# Patient Record
Sex: Male | Born: 1985 | Race: Black or African American | Hispanic: No | Marital: Single | Smoking: Never smoker
Health system: Southern US, Community
[De-identification: ages and names within clinical notes are randomized; demographics above are authoritative.]

## PROBLEM LIST (undated history)

## (undated) ENCOUNTER — Emergency Department: Payer: Self-pay

## (undated) ENCOUNTER — Emergency Department (HOSPITAL_COMMUNITY): Payer: Self-pay

---

## 2006-09-12 ENCOUNTER — Emergency Department (HOSPITAL_COMMUNITY): Admission: EM | Admit: 2006-09-12 | Discharge: 2006-09-12 | Payer: Self-pay | Admitting: Family Medicine

## 2007-01-12 ENCOUNTER — Emergency Department (HOSPITAL_COMMUNITY): Admission: EM | Admit: 2007-01-12 | Discharge: 2007-01-12 | Payer: Self-pay | Admitting: Family Medicine

## 2008-11-23 ENCOUNTER — Emergency Department (HOSPITAL_COMMUNITY): Admission: EM | Admit: 2008-11-23 | Discharge: 2008-11-23 | Payer: Self-pay | Admitting: Family Medicine

## 2009-05-22 ENCOUNTER — Emergency Department (HOSPITAL_COMMUNITY): Admission: EM | Admit: 2009-05-22 | Discharge: 2009-05-22 | Payer: Self-pay | Admitting: Emergency Medicine

## 2010-11-11 ENCOUNTER — Emergency Department (HOSPITAL_COMMUNITY)
Admission: EM | Admit: 2010-11-11 | Discharge: 2010-11-11 | Payer: Self-pay | Source: Home / Self Care | Admitting: Emergency Medicine

## 2015-08-23 ENCOUNTER — Emergency Department (INDEPENDENT_AMBULATORY_CARE_PROVIDER_SITE_OTHER)
Admission: EM | Admit: 2015-08-23 | Discharge: 2015-08-23 | Disposition: A | Discharge status: Home / Self Care | Payer: Self-pay | Source: Home / Self Care | Attending: Family Medicine | Admitting: Family Medicine

## 2015-08-23 DIAGNOSIS — M545 Low back pain, unspecified: Secondary | ICD-10-CM

## 2015-08-23 LAB — POCT URINALYSIS DIP (DEVICE)
BILIRUBIN URINE: NEGATIVE
Glucose, UA: NEGATIVE mg/dL
HGB URINE DIPSTICK: NEGATIVE
Ketones, ur: NEGATIVE mg/dL
LEUKOCYTES UA: NEGATIVE
Nitrite: NEGATIVE
Protein, ur: NEGATIVE mg/dL
SPECIFIC GRAVITY, URINE: 1.015 (ref 1.005–1.030)
Urobilinogen, UA: 1 mg/dL (ref 0.0–1.0)
pH: 7 (ref 5.0–8.0)

## 2015-08-23 MED ORDER — CYCLOBENZAPRINE HCL 5 MG PO TABS: 5.0000 mg | ORAL_TABLET | Freq: Two times a day (BID) | ORAL | Status: AC | PRN

## 2015-08-23 MED ORDER — NAPROXEN 500 MG PO TABS: 500.0000 mg | ORAL_TABLET | Freq: Two times a day (BID) | ORAL | Status: AC

## 2015-08-23 NOTE — Discharge Instructions (Signed)

## 2015-08-23 NOTE — ED Notes (Signed)
The patient [facility] with a complaint of left retroperitoneal back pain that has been going on for 7 days. The patient denied any injury or other UTI symptoms.

## 2015-08-23 NOTE — ED Provider Notes (Signed)
CSN: [id number]     Arrival date & time 08/23/15  1515 History   First MD Initiated Contact with Patient 08/23/15 1659     Chief Complaint  Patient presents with  . Back Pain   (Consider location/radiation/quality/duration/timing/severity/associated sxs/prior Treatment) Patient is a 29 y.o. male presenting with back pain. The history is provided by the patient. No language interpreter was used.  Back Pain Location:  Lumbar spine (Left lumbar spine) Quality:  Aching Radiates to: Left hip area. Pain severity:  Moderate (pain is about 7/10 in severity) Pain is:  Worse during the day Onset quality:  Gradual (She woke up with the pain. He moved a furniture a day before the pain started) Duration:  7 days Timing:  Intermittent Progression:  Waxing and waning Chronicity:  New Context: lifting heavy objects   Context: not emotional stress, not falling, not jumping from heights, not MVA and not occupational injury   Context comment:  Moved furniture few days prior Relieved by:  Bed rest Worsened by:  Movement Associated symptoms: no abdominal swelling, no bladder incontinence, no bowel incontinence, no dysuria, no fever, no leg pain, no paresthesias, no pelvic pain, no tingling and no weakness   Risk factors: no hx of osteoporosis and no lack of exercise     No past medical history on file. No past surgical history on file. No family history on file. Social History  Substance Use Topics  . Smoking status: Not on file  . Smokeless tobacco: Not on file  . Alcohol Use: Not on file    Review of Systems  Constitutional: Negative for fever.  Respiratory: Negative.   Cardiovascular: Negative.   Gastrointestinal: Negative for bowel incontinence.  Genitourinary: Negative for bladder incontinence, dysuria and pelvic pain.  Musculoskeletal: Positive for back pain. Negative for joint swelling and gait problem.  Neurological: Negative for tingling, weakness and paresthesias.  All other  systems reviewed and are negative.   Allergies  Review of patient's allergies indicates no known allergies.  Home Medications   Prior to Admission medications   Not on File   Meds Ordered and Administered this Visit  Medications - No data to display  BP 149/90 mmHg  Pulse 61  Temp(Src) 97.8 F (36.6 C) (Oral)  Resp 16  SpO2 99% No data found.   Physical Exam  Constitutional: He is oriented to person, place, and time. He appears well-developed. No distress.  Cardiovascular: Normal rate, regular rhythm and normal heart sounds.   No murmur heard. Pulmonary/Chest: Effort normal and breath sounds normal. No respiratory distress. He has no wheezes.  Abdominal: Soft.  Musculoskeletal:       Lumbar back: He exhibits decreased range of motion and tenderness.       Back:  Neurological: He is alert and oriented to person, place, and time. No cranial nerve deficit.  Nursing note and vitals reviewed.   ED Course  Procedures (including critical care time)  Labs Review Labs Reviewed  POCT URINALYSIS DIP (DEVICE)    Imaging Review No results found.   Visual Acuity Review  Right Eye Distance:   Left Eye Distance:   Bilateral Distance:    Right Eye Near:   Left Eye Near:    Bilateral Near:         MDM  No diagnosis found. Lumbar spine pain. Muscle spasm.  Patient reassured he likely pulled his muscle from heavy furniture he was lifting. No neurologic deficit. Naproxen prescribed prn pain and Flexeril for muscle spasm. Return  precaution discussed. Consider xray if no improvement in 2-3 wks. He agreed with plan.    Doreene ElandKehinde T Banesa Tristan, MD 08/23/15 [phone]

## 2017-11-29 ENCOUNTER — Other Ambulatory Visit: Payer: Self-pay

## 2017-11-29 ENCOUNTER — Emergency Department (HOSPITAL_COMMUNITY): Payer: Self-pay

## 2017-11-29 ENCOUNTER — Encounter (HOSPITAL_COMMUNITY): Payer: Self-pay | Admitting: *Deleted

## 2017-11-29 ENCOUNTER — Emergency Department (HOSPITAL_COMMUNITY)
Admission: EM | Admit: 2017-11-29 | Discharge: 2017-11-30 | Disposition: A | Discharge status: Home / Self Care | Payer: Self-pay | Attending: Emergency Medicine | Admitting: Emergency Medicine

## 2017-11-29 DIAGNOSIS — J111 Influenza due to unidentified influenza virus with other respiratory manifestations: Secondary | ICD-10-CM | POA: Insufficient documentation

## 2017-11-29 DIAGNOSIS — R69 Illness, unspecified: Secondary | ICD-10-CM

## 2017-11-29 LAB — CBC WITH DIFFERENTIAL/PLATELET
Basophils Absolute: 0 10*3/uL (ref 0.0–0.1)
Basophils Relative: 0 %
EOS ABS: 0 10*3/uL (ref 0.0–0.7)
EOS PCT: 0 %
HCT: 40.7 % (ref 39.0–52.0)
Hemoglobin: 13.3 g/dL (ref 13.0–17.0)
Lymphocytes Relative: 9 %
Lymphs Abs: 1 10*3/uL (ref 0.7–4.0)
MCH: 28.2 pg (ref 26.0–34.0)
MCHC: 32.7 g/dL (ref 30.0–36.0)
MCV: 86.4 fL (ref 78.0–100.0)
MONO ABS: 1.3 10*3/uL — AB (ref 0.1–1.0)
Monocytes Relative: 13 %
Neutro Abs: 8.2 10*3/uL — ABNORMAL HIGH (ref 1.7–7.7)
Neutrophils Relative %: 78 %
PLATELETS: 248 10*3/uL (ref 150–400)
RBC: 4.71 MIL/uL (ref 4.22–5.81)
RDW: 13.9 % (ref 11.5–15.5)
WBC: 10.6 10*3/uL — ABNORMAL HIGH (ref 4.0–10.5)

## 2017-11-29 LAB — COMPREHENSIVE METABOLIC PANEL
ALBUMIN: 4.3 g/dL (ref 3.5–5.0)
ALK PHOS: 103 U/L (ref 38–126)
ALT: 56 U/L (ref 17–63)
AST: 40 U/L (ref 15–41)
Anion gap: 13 (ref 5–15)
BILIRUBIN TOTAL: 0.7 mg/dL (ref 0.3–1.2)
BUN: 5 mg/dL — AB (ref 6–20)
CALCIUM: 9.2 mg/dL (ref 8.9–10.3)
CO2: 21 mmol/L — ABNORMAL LOW (ref 22–32)
CREATININE: 1.12 mg/dL (ref 0.61–1.24)
Chloride: 103 mmol/L (ref 101–111)
GFR calc Af Amer: >60 mL/min (ref >60)
GFR calc non Af Amer: >60 mL/min (ref >60)
GLUCOSE: 111 mg/dL — AB (ref 65–99)
Potassium: 3.9 mmol/L (ref 3.5–5.1)
Sodium: 137 mmol/L (ref 135–145)
Total Protein: 7.6 g/dL (ref 6.5–8.1)

## 2017-11-29 LAB — I-STAT CG4 LACTIC ACID, ED: Lactic Acid, Venous: 0.93 mmol/L (ref 0.5–1.9)

## 2017-11-29 MED ORDER — ONDANSETRON 8 MG PO TBDP: 8.0000 mg | ORAL_TABLET | Freq: Three times a day (TID) | ORAL | 0 refills | Status: AC | PRN

## 2017-11-29 MED ORDER — ACETAMINOPHEN 325 MG PO TABS
650.0000 mg | ORAL_TABLET | Freq: Once | ORAL | Status: AC
  Administered 2017-11-29: 650 mg via ORAL
  Filled 2017-11-29: qty 2

## 2017-11-29 MED ORDER — SODIUM CHLORIDE 0.9 % IV BOLUS (SEPSIS)
1000.0000 mL | Freq: Once | INTRAVENOUS | Status: AC
  Administered 2017-11-29: 1000 mL via INTRAVENOUS

## 2017-11-29 MED ORDER — SODIUM CHLORIDE 0.9 % IV SOLN: 1000.0000 mL | INTRAVENOUS | Status: DC

## 2017-11-29 MED ORDER — ONDANSETRON 4 MG PO TBDP
4.0000 mg | ORAL_TABLET | Freq: Once | ORAL | Status: AC
  Administered 2017-11-29: 4 mg via ORAL
  Filled 2017-11-29: qty 1

## 2017-11-29 MED ORDER — IBUPROFEN 800 MG PO TABS
800.0000 mg | ORAL_TABLET | Freq: Once | ORAL | Status: AC
  Administered 2017-11-29: 800 mg via ORAL
  Filled 2017-11-29: qty 1

## 2017-11-29 MED ORDER — ONDANSETRON HCL 4 MG/2ML IJ SOLN
4.0000 mg | Freq: Once | INTRAMUSCULAR | Status: AC
  Administered 2017-11-29: 4 mg via INTRAVENOUS
  Filled 2017-11-29: qty 2

## 2017-11-29 MED ORDER — OSELTAMIVIR PHOSPHATE 75 MG PO CAPS: 75.0000 mg | ORAL_CAPSULE | Freq: Two times a day (BID) | ORAL | 0 refills | Status: AC

## 2017-11-29 MED ORDER — BENZONATATE 100 MG PO CAPS: 100.0000 mg | ORAL_CAPSULE | Freq: Three times a day (TID) | ORAL | 0 refills | Status: AC

## 2017-11-29 NOTE — ED Provider Notes (Signed)
[facility] EMERGENCY DEPARTMENT Provider Note   CSN: [id number] Arrival date & time: 11/29/17  1708     History   Chief Complaint Chief Complaint  Patient presents with  . Influenza  . Emesis    HPI Thomas Delacruz is a 32 y.o. male.  HPI Pt started with uri sx 2 days ago.  Nasal congestion, body aches.  Pt has been coughing, having nausea  and vomiting. Not able to keep fluids down.  No flu shot this year.  Family members were recently diagnosed with the flu.  History reviewed. No pertinent past medical history.  There are no active problems to display for this patient.   History reviewed. No pertinent surgical history.     Home Medications    Prior to Admission medications   Medication Sig Start Date End Date Taking? Authorizing Provider  benzonatate (TESSALON) 100 MG capsule Take 1 capsule (100 mg total) by mouth every 8 (eight) hours. 11/29/17   Linwood Dibbles, MD  cyclobenzaprine (FLEXERIL) 5 MG tablet Take 1 tablet (5 mg total) by mouth 2 (two) times daily as needed for muscle spasms. 08/23/15   Doreene Eland, MD  naproxen (NAPROSYN) 500 MG tablet Take 1 tablet (500 mg total) by mouth 2 (two) times daily. 08/23/15   Doreene Eland, MD  ondansetron (ZOFRAN ODT) 8 MG disintegrating tablet Take 1 tablet (8 mg total) by mouth every 8 (eight) hours as needed for nausea or vomiting. 11/29/17   Linwood Dibbles, MD  oseltamivir (TAMIFLU) 75 MG capsule Take 1 capsule (75 mg total) by mouth 2 (two) times daily. 11/29/17   Linwood Dibbles, MD    Family History History reviewed. No pertinent family history.  Social History Social History   Tobacco Use  . Smoking status: Never Smoker  Substance Use Topics  . Alcohol use: Not on file  . Drug use: Not on file     Allergies   Patient has no known allergies.   Review of Systems Review of Systems  All other systems reviewed and are negative.    Physical Exam Updated Vital Signs BP 129/84   Pulse  67   Temp 100.1 F (37.8 C) (Oral)   Resp 18   SpO2 98%   Physical Exam  Constitutional: He appears well-developed and well-nourished. No distress.  HENT:  Head: Normocephalic and atraumatic.  Right Ear: External ear normal.  Left Ear: External ear normal.  Eyes: Conjunctivae are normal. Right eye exhibits no discharge. Left eye exhibits no discharge. No scleral icterus.  Neck: Neck supple. No tracheal deviation present.  Cardiovascular: Normal rate, regular rhythm and intact distal pulses.  Pulmonary/Chest: Effort normal and breath sounds normal. No stridor. No respiratory distress. He has no wheezes. He has no rales.  Abdominal: Soft. Bowel sounds are normal. He exhibits no distension. There is no tenderness. There is no rebound and no guarding.  Musculoskeletal: He exhibits no edema or tenderness.  Neurological: He is alert. He has normal strength. No cranial nerve deficit (no facial droop, extraocular movements intact, no slurred speech) or sensory deficit. He exhibits normal muscle tone. He displays no seizure activity. Coordination normal.  Skin: Skin is warm and dry. No rash noted.  Psychiatric: He has a normal mood and affect.  Nursing note and vitals reviewed.    ED Treatments / Results  Labs (all labs ordered are listed, but only abnormal results are displayed) Labs Reviewed  COMPREHENSIVE METABOLIC PANEL - Abnormal; Notable for the following  components:      Result Value   CO2 21 (*)    Glucose, Bld 111 (*)    BUN 5 (*)    All other components within normal limits  CBC WITH DIFFERENTIAL/PLATELET - Abnormal; Notable for the following components:   WBC 10.6 (*)    Neutro Abs 8.2 (*)    Monocytes Absolute 1.3 (*)    All other components within normal limits  I-STAT CG4 LACTIC ACID, ED     Radiology Dg Chest 2 View  Result Date: 11/29/2017 CLINICAL DATA:  Fever and cough for 1 week EXAM: CHEST  2 VIEW COMPARISON:  None. FINDINGS: The heart size and mediastinal  contours are within normal limits. Both lungs are clear. The visualized skeletal structures are unremarkable. IMPRESSION: No active cardiopulmonary disease. Electronically Signed   By: Alcide CleverMark  Lukens M.D.   On: 11/29/2017 21:43    Procedures Procedures (including critical care time)  Medications Ordered in ED Medications  sodium chloride 0.9 % bolus 1,000 mL (1,000 mLs Intravenous New Bag/Given 11/29/17 2208)    Followed by  sodium chloride 0.9 % bolus 1,000 mL (1,000 mLs Intravenous New Bag/Given 11/29/17 2216)    Followed by  0.9 %  sodium chloride infusion (not administered)  acetaminophen (TYLENOL) tablet 650 mg (650 mg Oral Given 11/29/17 1740)  ondansetron (ZOFRAN-ODT) disintegrating tablet 4 mg (4 mg Oral Given 11/29/17 1739)  ondansetron (ZOFRAN) injection 4 mg (4 mg Intravenous Given 11/29/17 2219)  ibuprofen (ADVIL,MOTRIN) tablet 800 mg (800 mg Oral Given 11/29/17 2220)     Initial Impression / Assessment and Plan / ED Course  I have reviewed the triage vital signs and the nursing notes.  Pertinent labs & imaging results that were available during my care of the patient were reviewed by me and considered in my medical decision making (see chart for details).  Patient presented to the emergency room with cough, congestion, symptoms suggestive of an influenza-like illness.  Patient is otherwise healthy without any significant medical problems.  His laboratory tests today are reassuring.  No significant abnormalities.  Chest x-ray does not show any pneumonia.  No signs of sepsis.  Patient was treated symptomatically with IV fluids plan on discharge home with supportive care  Final Clinical Impressions(s) / ED Diagnoses   Final diagnoses:  Influenza-like illness    ED Discharge Orders        Ordered    ondansetron (ZOFRAN ODT) 8 MG disintegrating tablet  Every 8 hours PRN     11/29/17 2334    benzonatate (TESSALON) 100 MG capsule  Every 8 hours     11/29/17 2334    oseltamivir  (TAMIFLU) 75 MG capsule  2 times daily     11/29/17 2334       Linwood DibblesKnapp, Enedelia Martorelli, MD 11/29/17 2335

## 2017-11-29 NOTE — ED Notes (Signed)
Patient holding trash can in waiting area asking for something to drink.  Advised we won't be able to give him anything to drink due to vomiting/needs to be seen by provider first.

## 2017-11-29 NOTE — Discharge Instructions (Signed)
Follow-up with a primary care doctor if you are not improving by next week, take the medications as prescribed, return as needed for worsening symptoms

## 2017-11-29 NOTE — ED Triage Notes (Signed)
Pt reports exposure to flu. Not feeling well x 2 days. Reports n/v along with headache, sore throat, cough, bodyaches, fever.mask on pt at triage.

## 2017-11-30 MED ORDER — BENZONATATE 100 MG PO CAPS
100.0000 mg | ORAL_CAPSULE | Freq: Once | ORAL | Status: AC
  Administered 2017-11-30: 100 mg via ORAL
  Filled 2017-11-30: qty 1

## 2019-01-06 IMAGING — CR DG CHEST 2V
2 series · 2 of 2 positions shown · non-contrast
Comparison: None.

CLINICAL DATA: Fever and cough for 1 week

EXAM:
CHEST  2 VIEW

[chest pa]
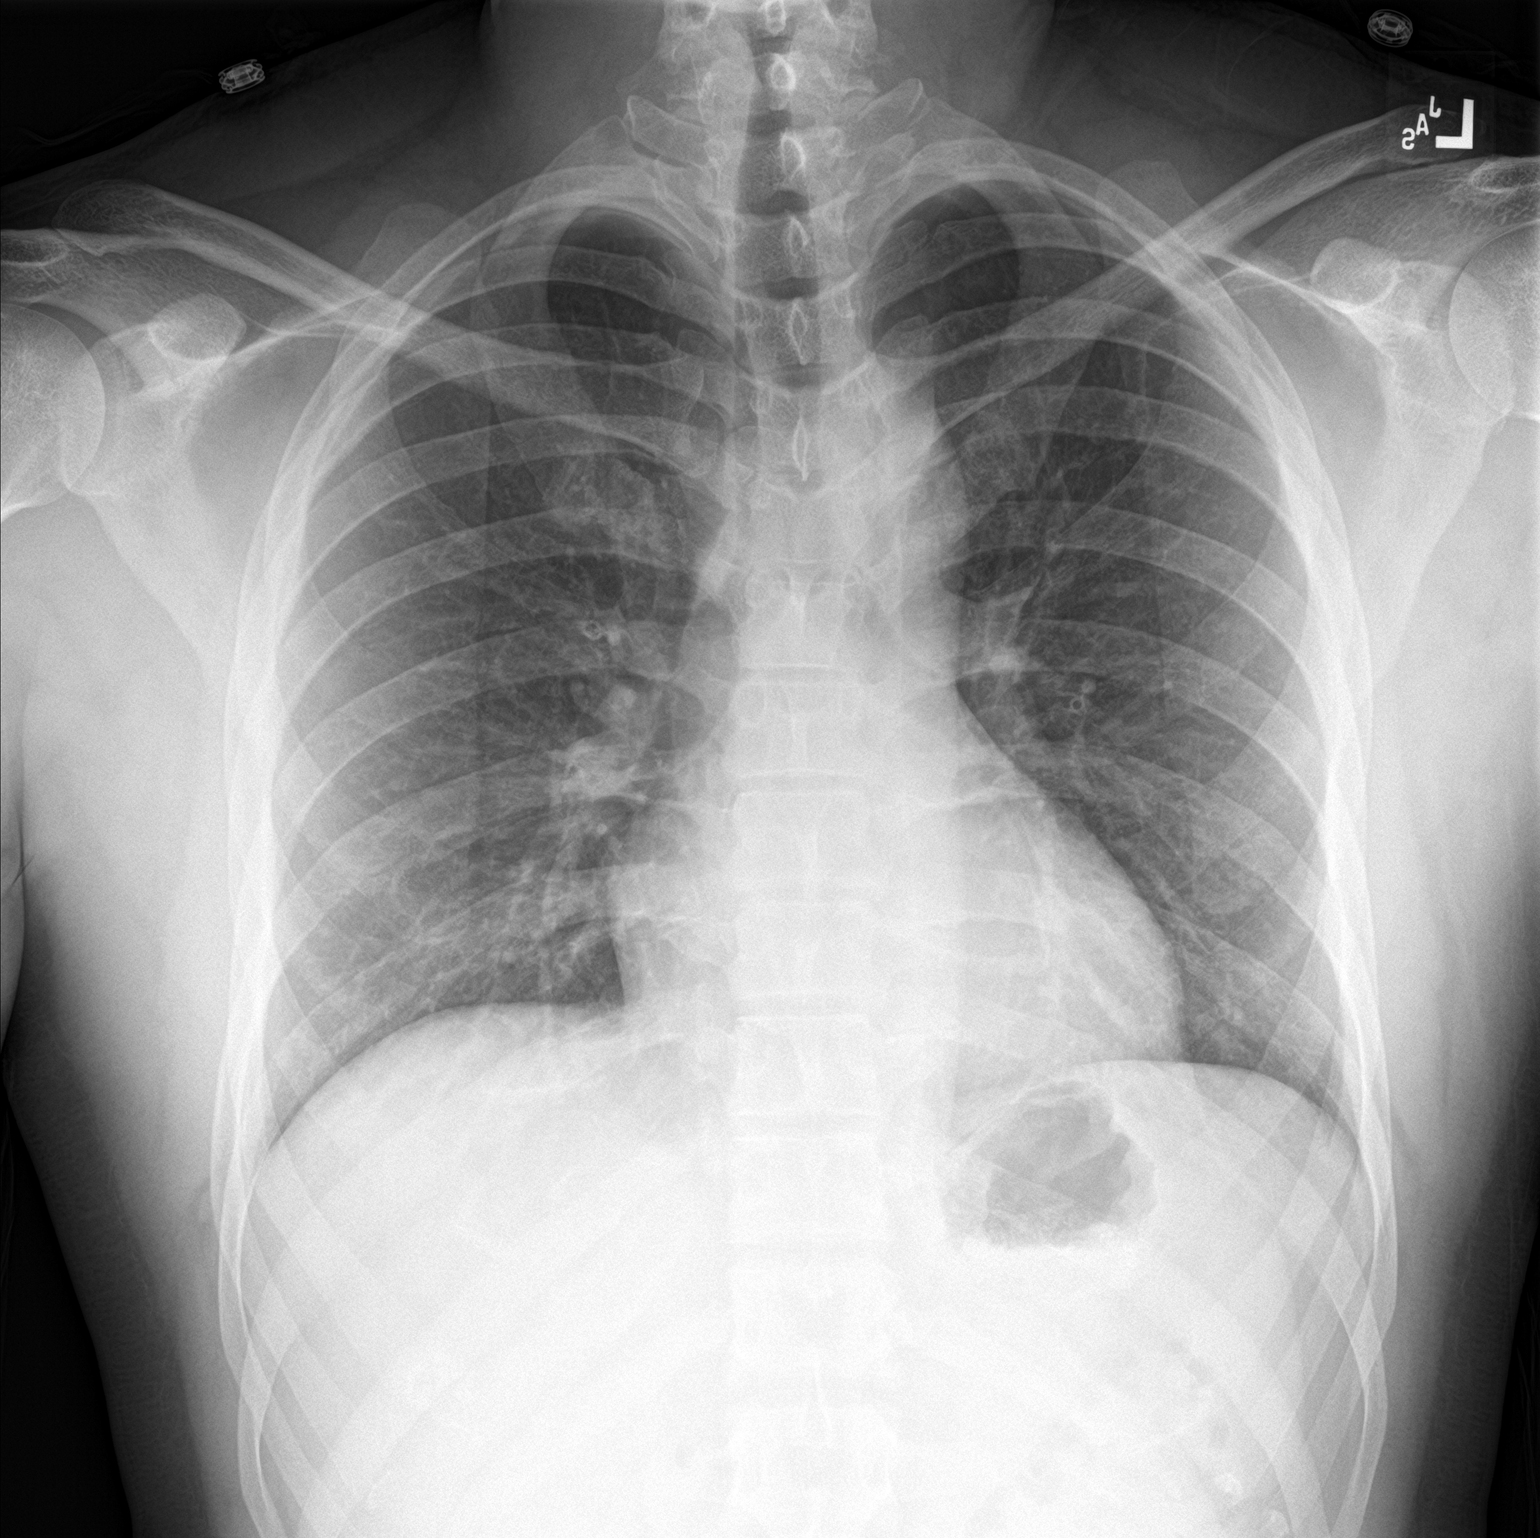

[chest lat]
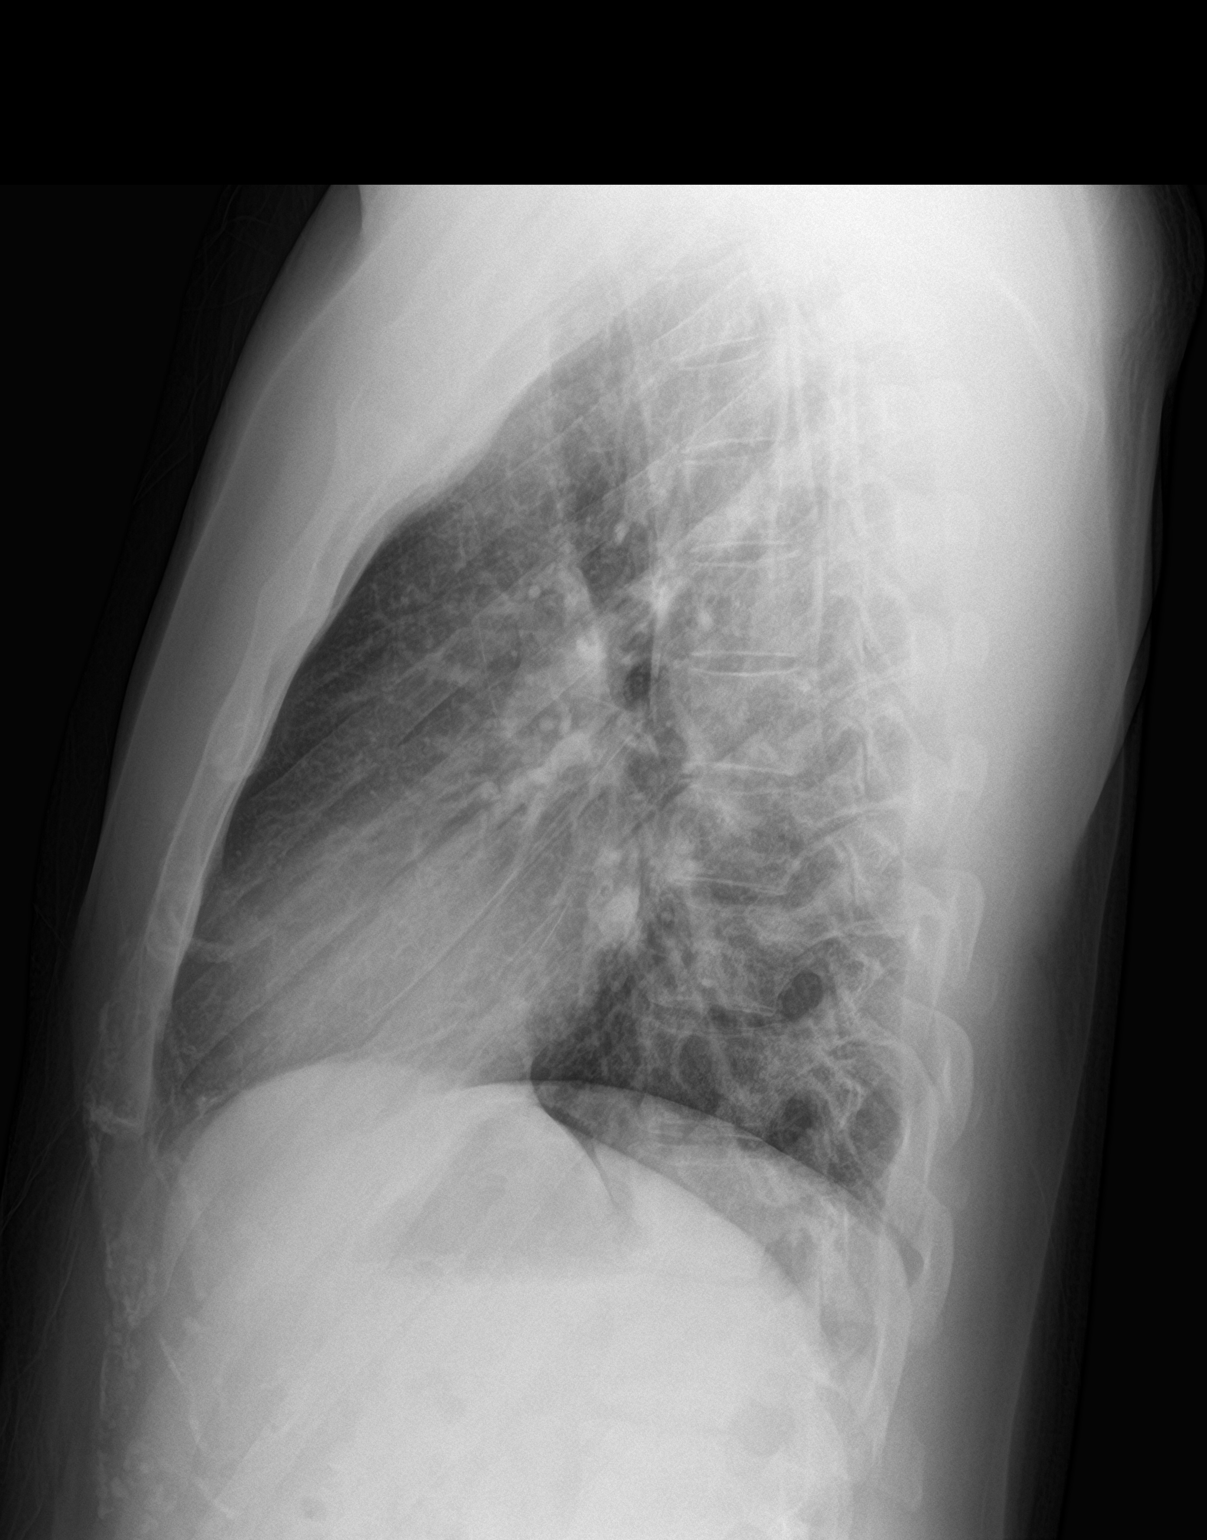

[2 of 2 positions shown; findings below may reference images not displayed]

FINDINGS: The heart size and mediastinal contours are within normal limits.
Both lungs are clear. The visualized skeletal structures are
unremarkable.
IMPRESSION: No active cardiopulmonary disease.

## 2020-03-09 ENCOUNTER — Other Ambulatory Visit: Payer: Self-pay

## 2020-03-09 ENCOUNTER — Emergency Department (HOSPITAL_COMMUNITY): Payer: Self-pay

## 2020-03-09 ENCOUNTER — Emergency Department (HOSPITAL_COMMUNITY)
Admission: EM | Admit: 2020-03-09 | Discharge: 2020-03-09 | Disposition: A | Discharge status: Home / Self Care | Payer: Self-pay | Attending: Emergency Medicine | Admitting: Emergency Medicine

## 2020-03-09 ENCOUNTER — Encounter (HOSPITAL_COMMUNITY): Payer: Self-pay | Admitting: Emergency Medicine

## 2020-03-09 DIAGNOSIS — N3001 Acute cystitis with hematuria: Secondary | ICD-10-CM

## 2020-03-09 DIAGNOSIS — N342 Other urethritis: Secondary | ICD-10-CM

## 2020-03-09 LAB — CBC WITH DIFFERENTIAL/PLATELET
Abs Immature Granulocytes: 0.04 10*3/uL (ref 0.00–0.07)
Basophils Absolute: 0.1 10*3/uL (ref 0.0–0.1)
Basophils Relative: 1 %
Eosinophils Absolute: 0.1 10*3/uL (ref 0.0–0.5)
Eosinophils Relative: 1 %
HCT: 37.7 % — ABNORMAL LOW (ref 39.0–52.0)
Hemoglobin: 11.7 g/dL — ABNORMAL LOW (ref 13.0–17.0)
Immature Granulocytes: 0 %
Lymphocytes Relative: 28 %
Lymphs Abs: 2.5 10*3/uL (ref 0.7–4.0)
MCH: 27.4 pg (ref 26.0–34.0)
MCHC: 31 g/dL (ref 30.0–36.0)
MCV: 88.3 fL (ref 80.0–100.0)
Monocytes Absolute: 0.6 10*3/uL (ref 0.1–1.0)
Monocytes Relative: 7 %
Neutro Abs: 5.7 10*3/uL (ref 1.7–7.7)
Neutrophils Relative %: 63 %
Platelets: 223 10*3/uL (ref 150–400)
RBC: 4.27 MIL/uL (ref 4.22–5.81)
RDW: 13.7 % (ref 11.5–15.5)
WBC: 9 10*3/uL (ref 4.0–10.5)
nRBC: 0 % (ref 0.0–0.2)

## 2020-03-09 LAB — BASIC METABOLIC PANEL
Anion gap: 8 (ref 5–15)
BUN: 7 mg/dL (ref 6–20)
CO2: 28 mmol/L (ref 22–32)
Calcium: 9.2 mg/dL (ref 8.9–10.3)
Chloride: 106 mmol/L (ref 98–111)
Creatinine, Ser: 0.88 mg/dL (ref 0.61–1.24)
GFR calc Af Amer: >60 mL/min (ref >60)
GFR calc non Af Amer: >60 mL/min (ref >60)
Glucose, Bld: 101 mg/dL — ABNORMAL HIGH (ref 70–99)
Potassium: 4 mmol/L (ref 3.5–5.1)
Sodium: 142 mmol/L (ref 135–145)

## 2020-03-09 LAB — URINALYSIS, ROUTINE W REFLEX MICROSCOPIC
Bilirubin Urine: NEGATIVE
Glucose, UA: NEGATIVE mg/dL
Ketones, ur: NEGATIVE mg/dL
Nitrite: NEGATIVE
Protein, ur: 100 mg/dL — AB
RBC / HPF: >50 RBC/hpf — ABNORMAL HIGH (ref 0–5)
Specific Gravity, Urine: 1.009 (ref 1.005–1.030)
WBC, UA: >50 WBC/hpf — ABNORMAL HIGH (ref 0–5)
pH: 7 (ref 5.0–8.0)

## 2020-03-09 MED ORDER — CEPHALEXIN 500 MG PO CAPS: 500.0000 mg | ORAL_CAPSULE | Freq: Three times a day (TID) | ORAL | 0 refills | Status: DC

## 2020-03-09 MED ORDER — CEFTRIAXONE SODIUM 500 MG IJ SOLR
500.0000 mg | Freq: Once | INTRAMUSCULAR | Status: AC
  Administered 2020-03-09: 500 mg via INTRAMUSCULAR
  Filled 2020-03-09: qty 500

## 2020-03-09 MED ORDER — AZITHROMYCIN 250 MG PO TABS
1000.0000 mg | ORAL_TABLET | Freq: Once | ORAL | Status: AC
  Administered 2020-03-09: 1000 mg via ORAL
  Filled 2020-03-09: qty 4

## 2020-03-09 NOTE — ED Triage Notes (Signed)
Pt states he is having hematuria since yesterday morning. With pain on his groin.

## 2020-03-09 NOTE — Discharge Instructions (Signed)
Take the antibiotics as prescribed.  Follow-up with your doctor.  You will be called if your urine culture grows bacteria and your antibiotic needs to be changed.  Return to the ED with worsening pain, fever, vomiting, any other concerns.

## 2020-03-09 NOTE — ED Provider Notes (Signed)
[facility] EMERGENCY DEPARTMENT Provider Note   CSN: [id number] Arrival date & time: 03/09/20  0107     History Chief Complaint  Patient presents with  . Hematuria    Thomas Delacruz is a 34 y.o. male.  Patient with intermittent hematuria, dysuria, frequency and urgency since yesterday.  Reports several episodes of bloody urine and then several episodes of clear urine but bleeding returned again last night.  Does have dysuria and pain with urination and urgency and frequency.  Has intermittent low back pain that comes and goes but none currently.  No abdominal pain.  No fever or vomiting.  Denies any testicular pain or penile discharge.  States he is sexually active with his fiance only.  Denies any chest pain or shortness of breath.  No history of kidney stones.  No previous abdominal surgeries.  The history is provided by the patient.  Hematuria Associated symptoms include abdominal pain. Pertinent negatives include no chest pain, no headaches and no shortness of breath.       History reviewed. No pertinent past medical history.  There are no problems to display for this patient.   History reviewed. No pertinent surgical history.     History reviewed. No pertinent family history.  Social History   Tobacco Use  . Smoking status: Never Smoker  . Smokeless tobacco: Never Used  Substance Use Topics  . Alcohol use: Yes  . Drug use: Never    Home Medications Prior to Admission medications   Medication Sig Start Date End Date Taking? Authorizing Provider  benzonatate (TESSALON) 100 MG capsule Take 1 capsule (100 mg total) by mouth every 8 (eight) hours. 11/29/17   Linwood Dibbles, MD  cyclobenzaprine (FLEXERIL) 5 MG tablet Take 1 tablet (5 mg total) by mouth 2 (two) times daily as needed for muscle spasms. 08/23/15   Doreene Eland, MD  naproxen (NAPROSYN) 500 MG tablet Take 1 tablet (500 mg total) by mouth 2 (two) times daily. 08/23/15   Doreene Eland, MD  ondansetron (ZOFRAN ODT) 8 MG disintegrating tablet Take 1 tablet (8 mg total) by mouth every 8 (eight) hours as needed for nausea or vomiting. 11/29/17   Linwood Dibbles, MD  oseltamivir (TAMIFLU) 75 MG capsule Take 1 capsule (75 mg total) by mouth 2 (two) times daily. 11/29/17   Linwood Dibbles, MD    Allergies    Patient has no known allergies.  Review of Systems   Review of Systems  Constitutional: Negative for activity change, appetite change and fever.  HENT: Negative for congestion and rhinorrhea.   Respiratory: Negative for cough, chest tightness and shortness of breath.   Cardiovascular: Negative for chest pain and leg swelling.  Gastrointestinal: Positive for abdominal pain. Negative for nausea and vomiting.  Genitourinary: Positive for dysuria, frequency, hematuria and urgency. Negative for testicular pain.  Musculoskeletal: Positive for back pain.  Skin: Negative for color change and rash.  Neurological: Negative for dizziness, weakness and headaches.   all other systems are negative except as noted in the HPI and PMH.    Physical Exam Updated Vital Signs BP 133/86 (BP Location: Left Arm)   Pulse 94   Temp 98 F (36.7 C) (Oral)   Resp 16   Ht 6\' 3"  (1.905 m)   Wt 85.7 kg   SpO2 100%   BMI 23.62 kg/m   Physical Exam Vitals and nursing note reviewed.  Constitutional:      General: He is not in acute distress.  Appearance: He is well-developed.  HENT:     Head: Normocephalic and atraumatic.     Mouth/Throat:     Pharynx: No oropharyngeal exudate.  Eyes:     Conjunctiva/sclera: Conjunctivae normal.     Pupils: Pupils are equal, round, and reactive to light.  Neck:     Comments: No meningismus. Cardiovascular:     Rate and Rhythm: Normal rate and regular rhythm.     Heart sounds: Normal heart sounds. No murmur.  Pulmonary:     Effort: Pulmonary effort is normal. No respiratory distress.     Breath sounds: Normal breath sounds.  Abdominal:      Palpations: Abdomen is soft.     Tenderness: There is no abdominal tenderness. There is no guarding or rebound.  Genitourinary:    Comments: Testicles nontender.  Circumcised.  No penile discharge Musculoskeletal:        General: No tenderness. Normal range of motion.     Cervical back: Normal range of motion and neck supple.  Skin:    General: Skin is warm.  Neurological:     Mental Status: He is alert and oriented to person, place, and time.     Cranial Nerves: No cranial nerve deficit.     Motor: No abnormal muscle tone.     Coordination: Coordination normal.     Comments: No ataxia on finger to nose bilaterally. No pronator drift. 5/5 strength throughout. CN 2-12 intact.Equal grip strength. Sensation intact.   Psychiatric:        Behavior: Behavior normal.     ED Results / Procedures / Treatments   Labs (all labs ordered are listed, but only abnormal results are displayed) Labs Reviewed  URINALYSIS, ROUTINE W REFLEX MICROSCOPIC - Abnormal; Notable for the following components:      Result Value   APPearance CLOUDY (*)    Hgb urine dipstick LARGE (*)    Protein, ur 100 (*)    Leukocytes,Ua LARGE (*)    RBC / HPF >50 (*)    WBC, UA >50 (*)    Bacteria, UA MANY (*)    All other components within normal limits  BASIC METABOLIC PANEL - Abnormal; Notable for the following components:   Glucose, Bld 101 (*)    All other components within normal limits  CBC WITH DIFFERENTIAL/PLATELET - Abnormal; Notable for the following components:   Hemoglobin 11.7 (*)    HCT 37.7 (*)    All other components within normal limits  URINE CULTURE    EKG None  Radiology CT Renal Stone Study  Result Date: 03/09/2020 CLINICAL DATA:  Hematuria. EXAM: CT ABDOMEN AND PELVIS WITHOUT CONTRAST TECHNIQUE: Multidetector CT imaging of the abdomen and pelvis was performed following the standard protocol without IV contrast. COMPARISON:  None. FINDINGS: Lower chest: No acute abnormality. Hepatobiliary:  No focal liver abnormality is seen. No gallstones, gallbladder wall thickening, or biliary dilatation. Pancreas: Unremarkable. No pancreatic ductal dilatation or surrounding inflammatory changes. Spleen: Normal in size without focal abnormality. Adrenals/Urinary Tract: Adrenal glands are unremarkable. Kidneys are normal, without renal calculi, focal lesion, or hydronephrosis. Mild circumferential bladder wall thickening. Stomach/Bowel: Stomach is within normal limits. Appendix appears normal. No evidence of bowel wall thickening, distention, or inflammatory changes. Vascular/Lymphatic: No significant vascular findings are present. No enlarged abdominal or pelvic lymph nodes. Reproductive: Prostate is unremarkable. Other: Tiny fat containing umbilical hernia. No free fluid or pneumoperitoneum. Musculoskeletal: No acute or significant osseous findings. IMPRESSION: 1. Mild circumferential bladder wall thickening, which could reflect  cystitis given clinical history. Correlate with urinalysis. 2. No urolithiasis. Electronically Signed   By: Obie Dredge M.D.   On: 03/09/2020 07:23    Procedures Procedures (including critical care time)  Medications Ordered in ED Medications - No data to display  ED Course  I have reviewed the triage vital signs and the nursing notes.  Pertinent labs & imaging results that were available during my care of the patient were reviewed by me and considered in my medical decision making (see chart for details).    MDM Rules/Calculators/A&P                     1 day of hematuria, urgency and dysuria.  Afebrile, no distress  Urinalysis shows many red cells, many white cells and leukocyte esterase.  We will send culture.  Suspicious for urethritis  Will treat with IM rocephin and zithromax.  CT without ureteral stone. Does show bladder wall thickening. Urine culture sent. Will also treat for UTI.   Safe sex practices discussed. followup with PCP. Return to the ED with  worsening pain, fever, vomiting, or any other concerns.  Final Clinical Impression(s) / ED Diagnoses Final diagnoses:  Acute cystitis with hematuria  Urethritis    Rx / DC Orders ED Discharge Orders    None       [place]molola Mittman, Jeannett Senior, MD 03/09/20 [phone]

## 2020-03-10 LAB — URINE CULTURE: Culture: >=100000 — AB

## 2020-03-11 ENCOUNTER — Telehealth: Payer: Self-pay

## 2020-03-11 NOTE — Telephone Encounter (Signed)
Post ED Visit - Positive Culture Follow-up  Culture report reviewed by antimicrobial stewardship pharmacist: [facility] []  , Pharm.D. []  Enzo Bi, Pharm.D., BCPS AQ-ID []  , Pharm.D., BCPS []  Celedonio Miyamoto, Pharm.D., BCPS []  [place], Garvin Fila.D., BCPS, AAHIVP []  , Pharm.D., BCPS, AAHIVP []  Georgina Pillion, PharmD, BCPS []  , PharmD, BCPS []  [place], PharmD, BCPS []  [address], PharmD []  , PharmD, BCPS []  Estella Husk, PharmD [facility] []  Lysle Pearl, PharmD []  , PharmD []  Phillips Climes, PharmD []  , Rph []  Agapito Games) , PharmD []  Verlan Friends, PharmD []  , PharmD []  Mervyn Gay, PharmD []  , PharmD []  Vinnie Level, PharmD []  Yolande Jolly, PharmD []  , PharmD []  Len Childs, PharmD   Positive urine culture Treated with Cephalexin, organism sensitive to the same and no further patient follow-up is required at this time.  03/11/2020, 9:57 AM

## 2021-04-16 IMAGING — CT CT RENAL STONE PROTOCOL
2 of 4 series · 16 of 46 positions shown, 18 images · non-contrast
Comparison: None.

CLINICAL DATA: Hematuria.

EXAM:
CT ABDOMEN AND PELVIS WITHOUT CONTRAST
TECHNIQUE: Multidetector CT imaging of the abdomen and pelvis was performed
following the standard protocol without IV contrast.

[Series 3: stone study 5.0 i30f 2 · axial · 0.63mm/px · z∈[-469,-29]mm · 13 of 96 slices shown, 15 images]
[im 4/96  soft-tissue]
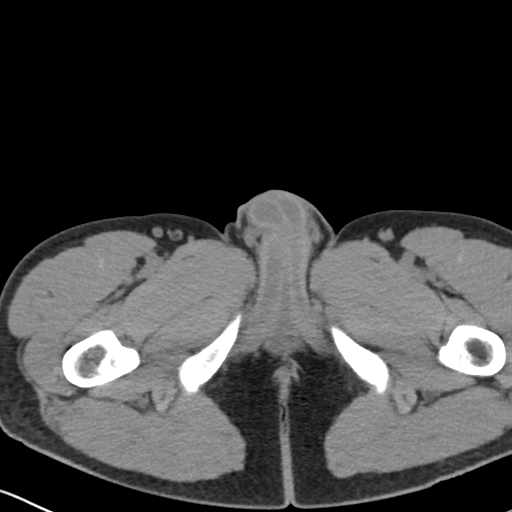
[im 4/96  bone]
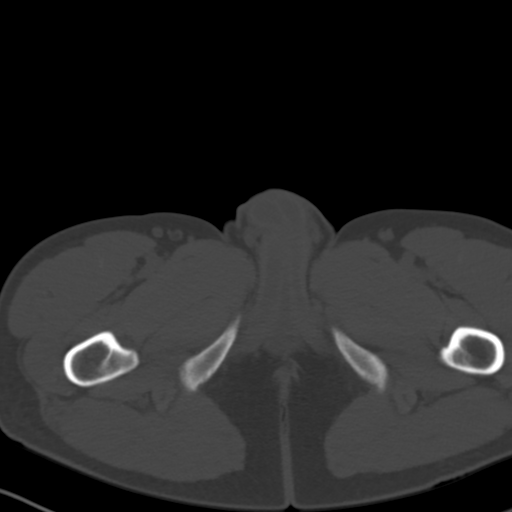
[im 11/96  soft-tissue]
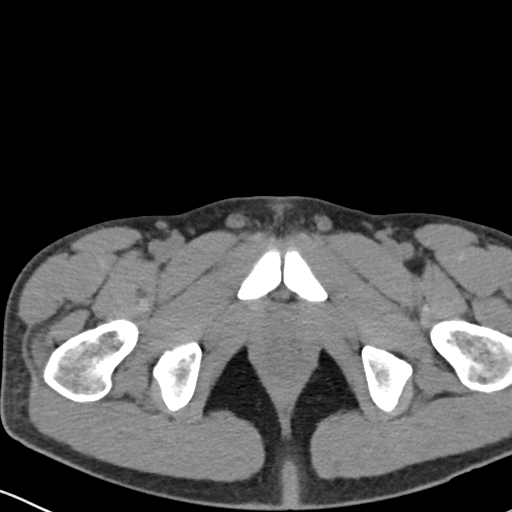
[im 19/96  soft-tissue]
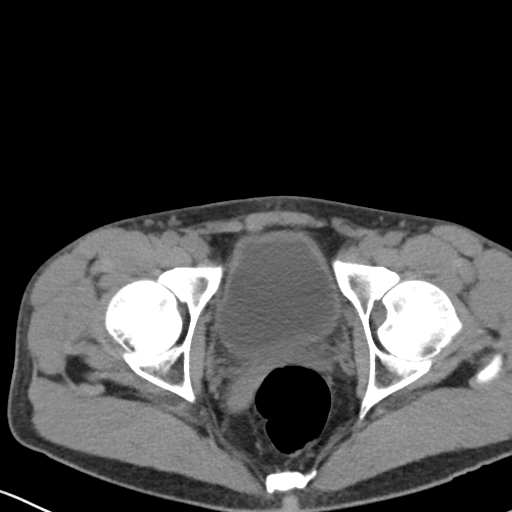
[im 26/96  soft-tissue]
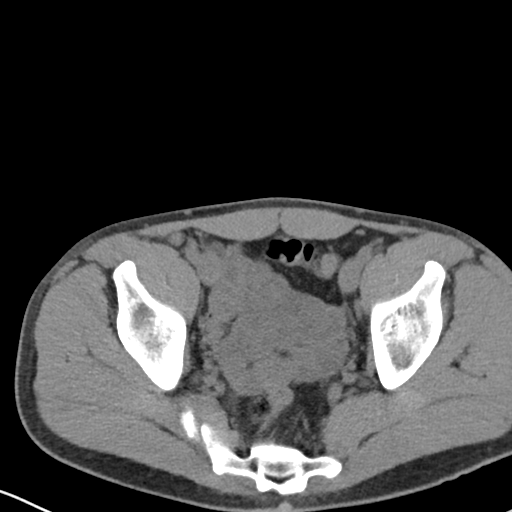
[im 33/96  soft-tissue]
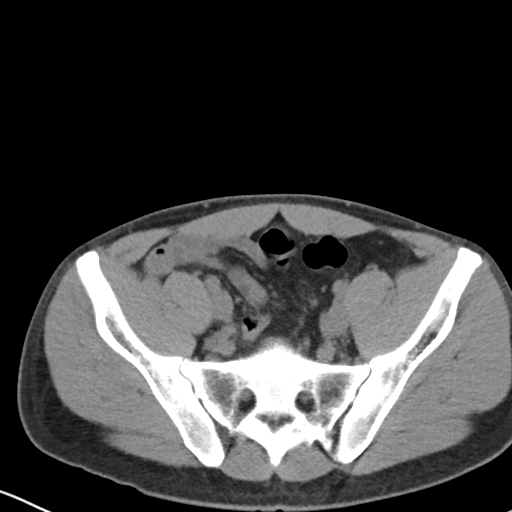
[im 41/96  soft-tissue]
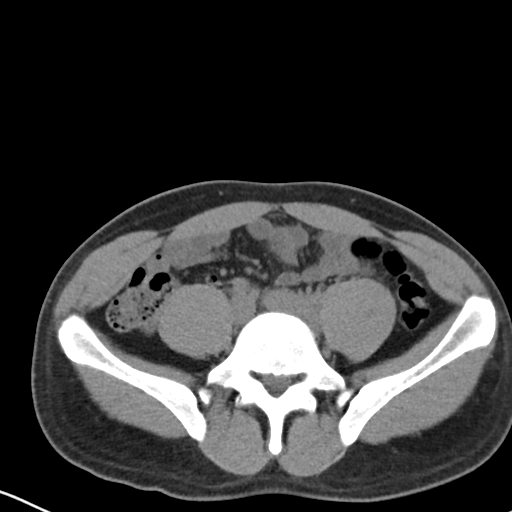
[im 48/96  soft-tissue]
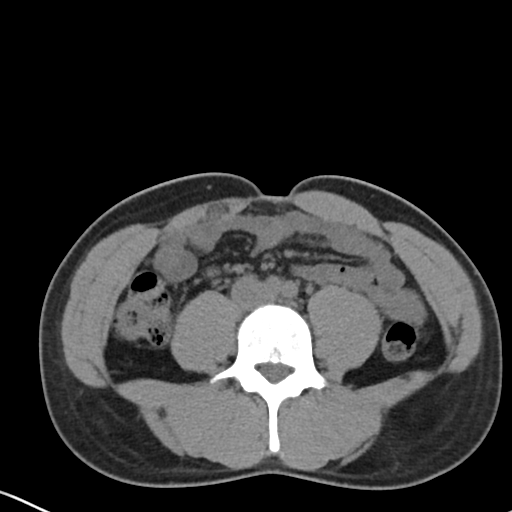
[im 55/96  soft-tissue]
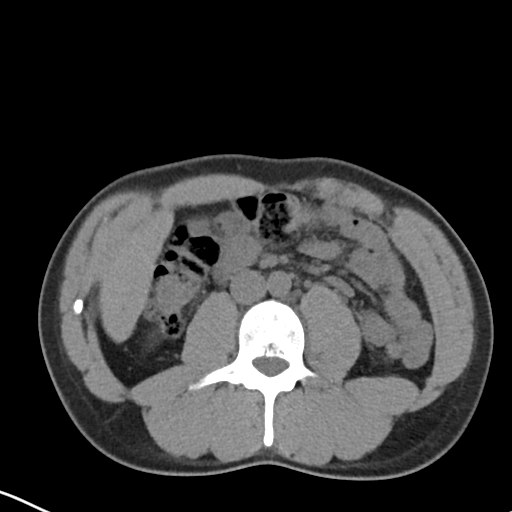
[im 63/96  soft-tissue]
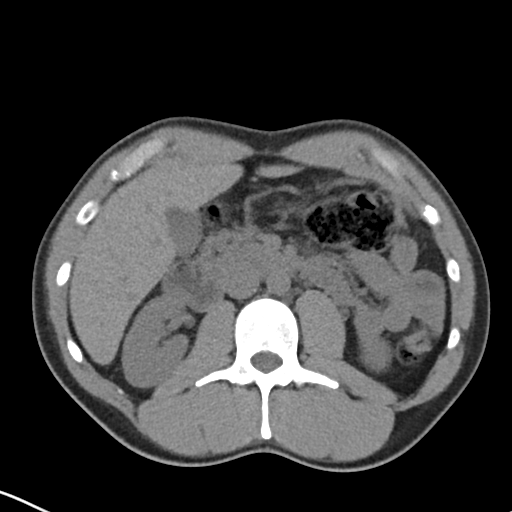
[im 63/96  bone]
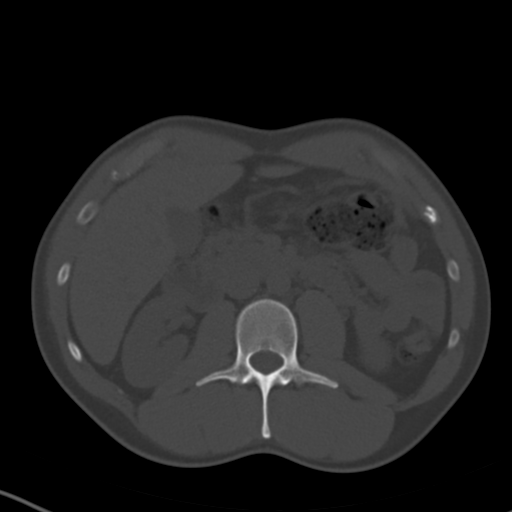
[im 70/96  soft-tissue]
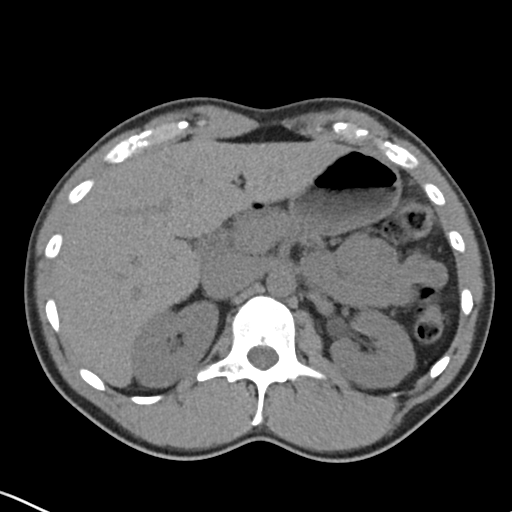
[im 77/96  soft-tissue]
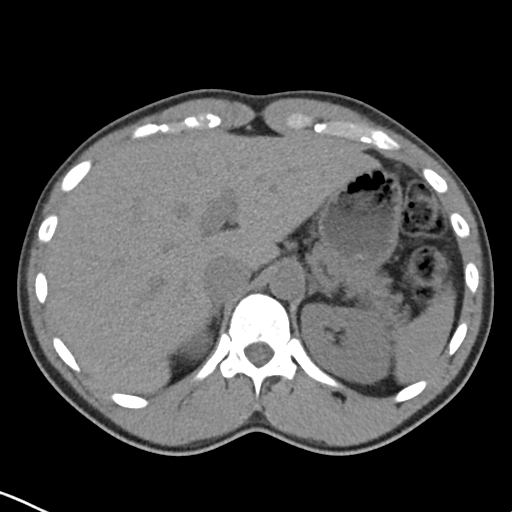
[im 85/96  soft-tissue]
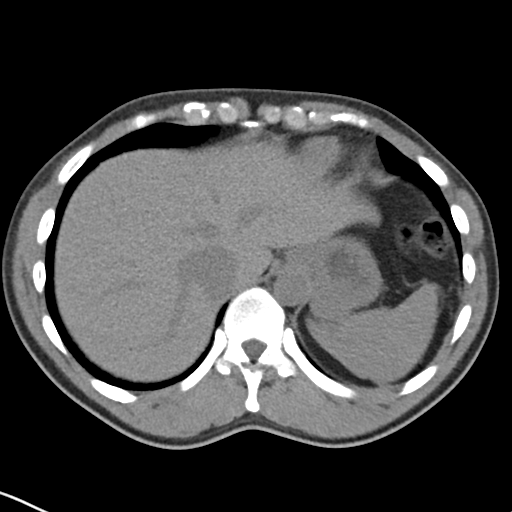
[im 92/96  soft-tissue]
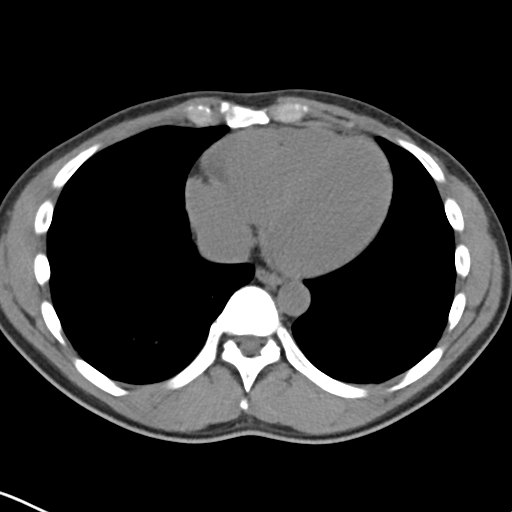

[Series 6: coronal soft tissue · coronal · 0.68mm/px · 3 of 101 slices shown]
[im 34/101  soft-tissue]
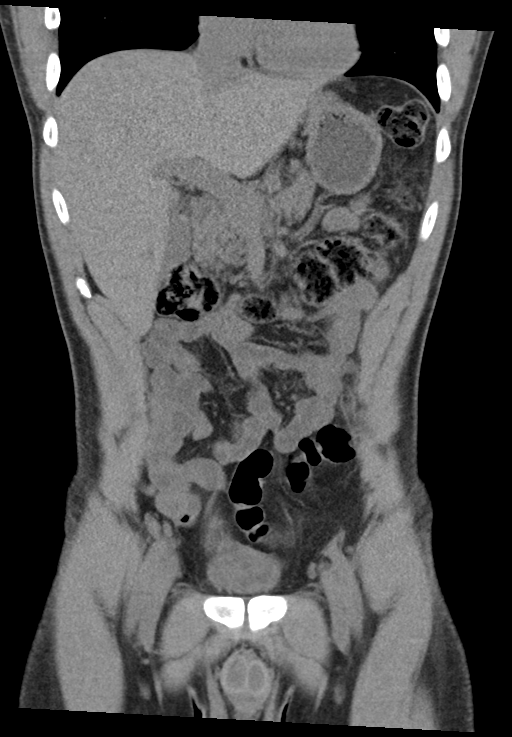
[im 45/101  soft-tissue]
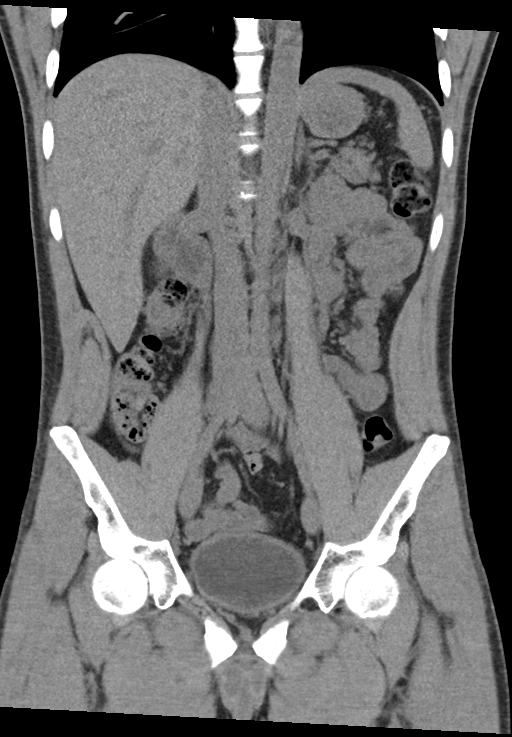
[im 56/101  soft-tissue]
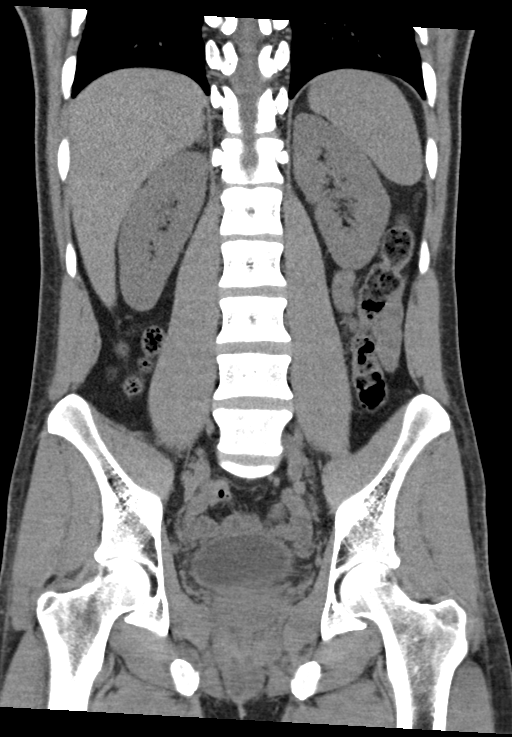

[16 of 46 positions shown; findings below may reference images not displayed]

FINDINGS: Lower chest: No acute abnormality.

Hepatobiliary: No focal liver abnormality is seen. No gallstones,
gallbladder wall thickening, or biliary dilatation.

Pancreas: Unremarkable. No pancreatic ductal dilatation or
surrounding inflammatory changes.

Spleen: Normal in size without focal abnormality.

Adrenals/Urinary Tract: Adrenal glands are unremarkable. Kidneys are
normal, without renal calculi, focal lesion, or hydronephrosis. Mild
circumferential bladder wall thickening.

Stomach/Bowel: Stomach is within normal limits. Appendix appears
normal. No evidence of bowel wall thickening, distention, or
inflammatory changes.

Vascular/Lymphatic: No significant vascular findings are present. No
enlarged abdominal or pelvic lymph nodes.

Reproductive: Prostate is unremarkable.

Other: Tiny fat containing umbilical hernia. No free fluid or
pneumoperitoneum.

Musculoskeletal: No acute or significant osseous findings.
IMPRESSION: 1. Mild circumferential bladder wall thickening, which could reflect
cystitis given clinical history. Correlate with urinalysis.
2. No urolithiasis.

## 2021-07-16 ENCOUNTER — Encounter (HOSPITAL_COMMUNITY): Payer: Self-pay

## 2021-07-16 ENCOUNTER — Other Ambulatory Visit: Payer: Self-pay

## 2021-07-16 ENCOUNTER — Emergency Department (HOSPITAL_COMMUNITY)
Admission: EM | Admit: 2021-07-16 | Discharge: 2021-07-16 | Disposition: A | Discharge status: Home / Self Care | Payer: Self-pay | Attending: Student | Admitting: Student

## 2021-07-16 DIAGNOSIS — U071 COVID-19: Secondary | ICD-10-CM | POA: Insufficient documentation

## 2021-07-16 DIAGNOSIS — Z28310 Unvaccinated for covid-19: Secondary | ICD-10-CM | POA: Insufficient documentation

## 2021-07-16 DIAGNOSIS — Z5321 Procedure and treatment not carried out due to patient leaving prior to being seen by health care provider: Secondary | ICD-10-CM | POA: Insufficient documentation

## 2021-07-16 LAB — SARS CORONAVIRUS 2 (TAT 6-24 HRS): SARS Coronavirus 2: POSITIVE — AB

## 2021-07-16 NOTE — ED Triage Notes (Signed)
Pt reports generalized body aches, cough, no appetite, hot flashes for the past 4 days, no vaccinated against COVID. Resp e/u

## 2021-07-16 NOTE — ED Notes (Signed)
Pt left AMA due to wait time  

## 2021-07-17 ENCOUNTER — Other Ambulatory Visit: Payer: Self-pay

## 2021-07-17 ENCOUNTER — Emergency Department (HOSPITAL_COMMUNITY)
Admission: EM | Admit: 2021-07-17 | Discharge: 2021-07-17 | Disposition: A | Discharge status: Home / Self Care | Payer: Self-pay | Attending: Emergency Medicine | Admitting: Emergency Medicine

## 2021-07-17 ENCOUNTER — Encounter (HOSPITAL_COMMUNITY): Payer: Self-pay | Admitting: Emergency Medicine

## 2021-07-17 DIAGNOSIS — Z28310 Unvaccinated for covid-19: Secondary | ICD-10-CM | POA: Insufficient documentation

## 2021-07-17 DIAGNOSIS — U071 COVID-19: Secondary | ICD-10-CM | POA: Insufficient documentation

## 2021-07-17 MED ORDER — NIRMATRELVIR/RITONAVIR (PAXLOVID)TABLET: 3.0000 | ORAL_TABLET | Freq: Two times a day (BID) | ORAL | 0 refills | Status: AC

## 2021-07-17 NOTE — ED Notes (Signed)
Patient given discharge instructions, all questions answered. Patient in possession of all belongings, directed to the discharge area  

## 2021-07-17 NOTE — ED Notes (Signed)
Patient ambulatory to room in NAD.

## 2021-07-17 NOTE — ED Triage Notes (Signed)
Pt states he was seen in ED yesterday and received +COVID results on his MyChart.  States he saw that he had a Rx available on MyChart as well but [facility] on [address] is telling him they are not there.  Pt showed me his MyChart and he was looking at an antibiotic from May.  Informed him there are no new Rx on his MyChart for COVID.  My requesting Paxlovid.  States he has a headache and body aches but is trying to stay busy to "forget about them."  Denies any other symptoms.NAD.

## 2021-07-17 NOTE — ED Provider Notes (Signed)
[facility] Provider Note   CSN: [id number] Arrival date & time: 07/17/21  1334     History Chief Complaint  Patient presents with   Covid Positive    [facility] is a 35 y.o. male diagnosed COVID-positive yesterday.  Today he is requesting fax over for treatment of his symptoms.  No COVID vaccines.  Endorsing headache, rhinorrhea, chills, sore throat, emesisx1, body aches and overall fatigue. These sx began on Monday. Denies SOB, CP, palpitations.  Has taken Tylenol at home which has helped with his headache and body chills however "when he gets to be dusk or Dawn things become bad for me all over again."  History reviewed. No pertinent past medical history.  There are no problems to display for this patient.   History reviewed. No pertinent surgical history.     No family history on file.  Social History   Tobacco Use   Smoking status: Never   Smokeless tobacco: Never  Substance Use Topics   Alcohol use: Yes   Drug use: Never    Home Medications Prior to Admission medications   Medication Sig Start Date End Date Taking? Authorizing Provider  benzonatate (TESSALON) 100 MG capsule Take 1 capsule (100 mg total) by mouth every 8 (eight) hours. Patient not taking: Reported on 03/09/2020 11/29/17   Linwood Dibbles, MD  cephALEXin (KEFLEX) 500 MG capsule Take 1 capsule (500 mg total) by mouth 3 (three) times daily. 03/09/20   Rancour, Jeannett Senior, MD  cyclobenzaprine (FLEXERIL) 5 MG tablet Take 1 tablet (5 mg total) by mouth 2 (two) times daily as needed for muscle spasms. Patient not taking: Reported on 03/09/2020 08/23/15   Doreene Eland, MD  naproxen (NAPROSYN) 500 MG tablet Take 1 tablet (500 mg total) by mouth 2 (two) times daily. Patient not taking: Reported on 03/09/2020 08/23/15   Doreene Eland, MD  ondansetron (ZOFRAN ODT) 8 MG disintegrating tablet Take 1 tablet (8 mg total) by mouth every 8 (eight) hours as needed for nausea  or vomiting. Patient not taking: Reported on 03/09/2020 11/29/17   Linwood Dibbles, MD  oseltamivir (TAMIFLU) 75 MG capsule Take 1 capsule (75 mg total) by mouth 2 (two) times daily. Patient not taking: Reported on 03/09/2020 11/29/17   Linwood Dibbles, MD    Allergies    Patient has no known allergies.  Review of Systems   Review of Systems  Constitutional:  Positive for chills, fatigue and fever.  HENT:  Positive for congestion, rhinorrhea and sore throat.   Respiratory:  Negative for cough, chest tightness and shortness of breath.   Cardiovascular:  Negative for chest pain, palpitations and leg swelling.  Neurological:  Positive for headaches.  All other systems reviewed and are negative.  Physical Exam Updated Vital Signs BP 105/75 (BP Location: Left Arm)   Pulse (!) 50   Temp 99.1 F (37.3 C)   Resp 16   SpO2 100%   Physical Exam Vitals and nursing note reviewed.  Constitutional:      Appearance: Normal appearance.  HENT:     Head: Normocephalic and atraumatic.     Right Ear: Tympanic membrane normal.     Left Ear: Tympanic membrane normal.     Nose: Nose normal.     Mouth/Throat:     Mouth: Mucous membranes are moist.     Pharynx: Oropharynx is clear.  Eyes:     General: No scleral icterus.    Conjunctiva/sclera: Conjunctivae normal.  Pupils: Pupils are equal, round, and reactive to light.  Cardiovascular:     Rate and Rhythm: Normal rate and regular rhythm.  Pulmonary:     Effort: Pulmonary effort is normal. No respiratory distress.     Breath sounds: Normal breath sounds. No wheezing or rales.  Skin:    General: Skin is warm.     Findings: No rash.  Neurological:     Mental Status: He is alert.  Psychiatric:        Mood and Affect: Mood normal.        Behavior: Behavior normal.    ED Results / Procedures / Treatments   Labs (all labs ordered are listed, but only abnormal results are displayed) Labs Reviewed - No data to display  EKG None  Radiology No  results found.  Procedures Procedures   Medications Ordered in ED Medications - No data to display  ED Course  I have reviewed the triage vital signs and the nursing notes.  Pertinent labs & imaging results that were available during my care of the patient were reviewed by me and considered in my medical decision making (see chart for details).    MDM Rules/Calculators/A&P Thomas Del[facility]ruz is a 35 y.o. male diagnosed COVID-positive yesterday.  Today he is requesting fax over for treatment of his symptoms.  No COVID vaccines.  Endorsing headache, rhinorrhea, chills, sore throat, emesisx1, body aches and overall fatigue. These sx began on Monday. Denies SOB, CP, palpitations.  Has taken Tylenol at home which has helped with his headache and body chills however "when he gets to be dusk or Dawn things become bad for me all over again."  Patient without kidney disease and last GFR greater than 60.  He also denies any other medical conditions and does not take any medications.  I do not see any contraindications to treating the patient with Paxlovid for his symptoms.  He is at high risk for severe disease due to his lack of vaccinations.  I have sent the medication to the pharmacy. Final Clinical Impression(s) / ED Diagnoses Final diagnoses:  COVID-19    Rx / DC Orders Results and diagnoses were explained to the patient. Return precautions discussed in full. Patient had no additional questions and expressed complete understanding.     Saddie Benders, PA-C 07/17/21 1516    Edwin Dada P, DO 07/18/21 1126

## 2021-07-17 NOTE — Discharge Instructions (Addendum)
Your medication has been sent to the pharmacy.  It was a pleasure to meet you and I hope that you feel better.

## 2022-07-31 ENCOUNTER — Emergency Department (HOSPITAL_COMMUNITY)
Admission: EM | Admit: 2022-07-31 | Discharge: 2022-07-31 | Disposition: A | Discharge status: Home / Self Care | Payer: Self-pay | Attending: Emergency Medicine | Admitting: Emergency Medicine

## 2022-07-31 ENCOUNTER — Emergency Department (HOSPITAL_COMMUNITY): Payer: Self-pay

## 2022-07-31 ENCOUNTER — Encounter (HOSPITAL_COMMUNITY): Payer: Self-pay | Admitting: Emergency Medicine

## 2022-07-31 DIAGNOSIS — R1013 Epigastric pain: Secondary | ICD-10-CM | POA: Insufficient documentation

## 2022-07-31 LAB — CBC
HCT: 39.4 % (ref 39.0–52.0)
Hemoglobin: 12.8 g/dL — ABNORMAL LOW (ref 13.0–17.0)
MCH: 28.4 pg (ref 26.0–34.0)
MCHC: 32.5 g/dL (ref 30.0–36.0)
MCV: 87.4 fL (ref 80.0–100.0)
Platelets: 243 10*3/uL (ref 150–400)
RBC: 4.51 MIL/uL (ref 4.22–5.81)
RDW: 14.3 % (ref 11.5–15.5)
WBC: 8.4 10*3/uL (ref 4.0–10.5)
nRBC: 0 % (ref 0.0–0.2)

## 2022-07-31 LAB — COMPREHENSIVE METABOLIC PANEL
ALT: 14 U/L (ref 0–44)
AST: 18 U/L (ref 15–41)
Albumin: 4.4 g/dL (ref 3.5–5.0)
Alkaline Phosphatase: 72 U/L (ref 38–126)
Anion gap: 6 (ref 5–15)
BUN: <5 mg/dL — ABNORMAL LOW (ref 6–20)
CO2: 27 mmol/L (ref 22–32)
Calcium: 9.5 mg/dL (ref 8.9–10.3)
Chloride: 108 mmol/L (ref 98–111)
Creatinine, Ser: 1.03 mg/dL (ref 0.61–1.24)
GFR, Estimated: >60 mL/min (ref >60)
Glucose, Bld: 95 mg/dL (ref 70–99)
Potassium: 4.4 mmol/L (ref 3.5–5.1)
Sodium: 141 mmol/L (ref 135–145)
Total Bilirubin: 1.2 mg/dL (ref 0.3–1.2)
Total Protein: 7 g/dL (ref 6.5–8.1)

## 2022-07-31 LAB — LIPASE, BLOOD: Lipase: 25 U/L (ref 11–51)

## 2022-07-31 LAB — URINALYSIS, ROUTINE W REFLEX MICROSCOPIC
Bilirubin Urine: NEGATIVE
Glucose, UA: NEGATIVE mg/dL
Hgb urine dipstick: NEGATIVE
Ketones, ur: NEGATIVE mg/dL
Leukocytes,Ua: NEGATIVE
Nitrite: NEGATIVE
Protein, ur: NEGATIVE mg/dL
Specific Gravity, Urine: 1.016 (ref 1.005–1.030)
pH: 8 (ref 5.0–8.0)

## 2022-07-31 MED ORDER — IOHEXOL 350 MG/ML SOLN
75.0000 mL | Freq: Once | INTRAVENOUS | Status: AC | PRN
  Administered 2022-07-31: 75 mL via INTRAVENOUS

## 2022-07-31 NOTE — ED Notes (Signed)
Pt has stated that he will leave at 5pm if not seen.

## 2022-07-31 NOTE — ED Triage Notes (Addendum)
Patient BIB [facility] from home with complaint of sharp, stabbing abdominal pain that started yesterday, exacerbated by movement and deep inspiration. Pain is alleviated by rest. Patient is alert, oriented, and in no apparent distress at this time.

## 2022-07-31 NOTE — Discharge Instructions (Signed)
As discussed, without completing her work-up, there is a risk of severe disability or death.  Try to take additional ibuprofen or medication for gastric reflux such as omeprazole or Pepcid.  You are always welcome to return to the ER if have any new symptoms or are feeling worse.

## 2022-07-31 NOTE — ED Provider Notes (Signed)
[facility] EMERGENCY DEPARTMENT Provider Note   CSN: [id number] Arrival date & time: 07/31/22  1224     History  Chief Complaint  Patient presents with   Abdominal Pain    Thomas Delacruz is a 36 y.o. male.  HPI 37 year old male who presents to the ER with complaints of epigastric/chest pain.  This started at about 2 AM this morning.  He states the pain waxes and wanes.  He states at its worst it feels sharp and stabbing and "brings him to his knees".  He denies any nausea or vomiting.  He states it feels like it will travel into his back at times.  He states that it is worse with movement, states he will feel it if he turns a certain way. Taking a deep breath makes it worse.  Laying still does help relieve it.  He denies any associated dizziness or near syncope.  Denies any nausea or vomiting.  No fevers or chills.  He states that he does not know of any medical problems but does not go to the doctor.  He denies any alcohol or drug use.  Per review of triage note, patient was getting verbally aggressive and agitated about the wait time.  He states that "nothing is wrong with me now that have been here too long".     Home Medications Prior to Admission medications   Medication Sig Start Date End Date Taking? Authorizing Provider  benzonatate (TESSALON) 100 MG capsule Take 1 capsule (100 mg total) by mouth every 8 (eight) hours. Patient not taking: Reported on 03/09/2020 11/29/17   Linwood Dibbles, MD  cephALEXin (KEFLEX) 500 MG capsule Take 1 capsule (500 mg total) by mouth 3 (three) times daily. 03/09/20   Rancour, Jeannett Senior, MD  cyclobenzaprine (FLEXERIL) 5 MG tablet Take 1 tablet (5 mg total) by mouth 2 (two) times daily as needed for muscle spasms. Patient not taking: Reported on 03/09/2020 08/23/15   Doreene Eland, MD  naproxen (NAPROSYN) 500 MG tablet Take 1 tablet (500 mg total) by mouth 2 (two) times daily. Patient not taking: Reported on 03/09/2020 08/23/15    Doreene Eland, MD  ondansetron (ZOFRAN ODT) 8 MG disintegrating tablet Take 1 tablet (8 mg total) by mouth every 8 (eight) hours as needed for nausea or vomiting. Patient not taking: Reported on 03/09/2020 11/29/17   Linwood Dibbles, MD  oseltamivir (TAMIFLU) 75 MG capsule Take 1 capsule (75 mg total) by mouth 2 (two) times daily. Patient not taking: Reported on 03/09/2020 11/29/17   Linwood Dibbles, MD      Allergies    Patient has no known allergies.    Review of Systems   Review of Systems Ten systems reviewed and are negative for acute change, except as noted in the HPI.   Physical Exam Updated Vital Signs BP 129/81 (BP Location: Right Arm)   Pulse (!) 48   Temp 98.1 F (36.7 C) (Oral)   Resp 13   SpO2 100%  Physical Exam Vitals and nursing note reviewed.  Constitutional:      General: He is not in acute distress.    Appearance: He is well-developed.  HENT:     Head: Normocephalic and atraumatic.  Eyes:     Conjunctiva/sclera: Conjunctivae normal.  Cardiovascular:     Rate and Rhythm: Normal rate and regular rhythm.     Heart sounds: No murmur heard. Pulmonary:     Effort: Pulmonary effort is normal. No respiratory distress.  Breath sounds: Normal breath sounds.  Chest:    Abdominal:     Palpations: Abdomen is soft.     Tenderness: There is no abdominal tenderness.  Musculoskeletal:        General: No swelling.       Arms:     Cervical back: Neck supple.     Comments: Mild reproducible tenderness to the anterior chest wall and back.  No significant epigastric or abdominal tenderness.  Skin:    General: Skin is warm and dry.     Capillary Refill: Capillary refill takes less than 2 seconds.  Neurological:     Mental Status: He is alert.  Psychiatric:        Mood and Affect: Mood normal.     ED Results / Procedures / Treatments   Labs (all labs ordered are listed, but only abnormal results are displayed) Labs Reviewed  COMPREHENSIVE METABOLIC PANEL - Abnormal;  Notable for the following components:      Result Value   BUN <5 (*)    All other components within normal limits  CBC - Abnormal; Notable for the following components:   Hemoglobin 12.8 (*)    All other components within normal limits  LIPASE, BLOOD  URINALYSIS, ROUTINE W REFLEX MICROSCOPIC    EKG None  Radiology CT ABDOMEN PELVIS W CONTRAST  Result Date: 07/31/2022 CLINICAL DATA:  Abdominal pain.  Stabbing abdominal pain for 1 day. EXAM: CT ABDOMEN AND PELVIS WITH CONTRAST TECHNIQUE: Multidetector CT imaging of the abdomen and pelvis was performed using the standard protocol following bolus administration of intravenous contrast. RADIATION DOSE REDUCTION: This exam was performed according to the departmental dose-optimization program which includes automated exposure control, adjustment of the mA and/or kV according to patient size and/or use of iterative reconstruction technique. CONTRAST:  41mL OMNIPAQUE IOHEXOL 350 MG/ML SOLN COMPARISON:  None Available. FINDINGS: Lower chest: Lung bases are clear. Hepatobiliary: No focal hepatic lesion. No biliary duct dilatation. Common bile duct is normal. Pancreas: Pancreas is normal. No ductal dilatation. No pancreatic inflammation. Spleen: Normal spleen Adrenals/urinary tract: Adrenal glands and kidneys are normal. The ureters and bladder normal. Stomach/Bowel: Stomach, small bowel, appendix, and cecum are normal. The colon and rectosigmoid colon are normal. Vascular/Lymphatic: Abdominal aorta is normal caliber. No periportal or retroperitoneal adenopathy. No pelvic adenopathy. Reproductive: Prostate unremarkable. Other: Trace free fluid in the deep RIGHT pelvis simple fluid attenuation. Musculoskeletal: No aggressive osseous lesion. IMPRESSION: 1. No explanation for acute abdominal pain. 2. Appendix and gallbladder normal. 3. No obstructive uropathy. Electronically Signed   By: Suzy Bouchard M.D.   On: 07/31/2022 16:10    Procedures Procedures     Medications Ordered in ED Medications  iohexol (OMNIPAQUE) 350 MG/ML injection 75 mL (75 mLs Intravenous Contrast Given 07/31/22 1530)    ED Course/ Medical Decision Making/ A&P                           Medical Decision Making Amount and/or Complexity of Data Reviewed Labs: ordered.  36 year old male presenting with epigastric/chest/back pain which started this morning.  On arrival, he is slightly bradycardic with a heart rate of 48 EKG with sinus bradycardia.  Physical exam with some mild reproducible left-sided rib cage/spinal tenderness, mild anterior chest wall tenderness.  No significant abdominal tenderness.  Lab work ordered in triage, reviewed and interpreted by me, CBC and CMP largely unremarkable, mild anemia of 12.8.  UA and lipase normal.  CT of  the abdomen ordered in triage, reviewed, agree with radiology read.  Negative for acute findings.  I discussed with the patient that given that his pain is in his chest, I would recommend a troponin of the D-dimer to rule out a PE.  Dissection is also on the differential however vitals are overall reassuring.  The patient refused this and stated that he just wants to go home.  We discussed the nature and purpose, risks and benefits, as well as, the alternatives of treatment, I suggested that he can take additional ibuprofen, or possibly a PPI/H2 blocker for GERD.. Time was given to allow the opportunity to ask questions and consider their options, and after the discussion, the patient decided to refuse the offerred further testing. The patient was informed that refusal could lead to, but was not limited to, death, permanent disability, or severe pain. If present, I asked the relatives or significant others to dissuade them without success. Prior to refusing, I determined that the patient had the capacity to make their decision and understood the consequences of that decision. After refusal, I made every reasonable opportunity to treat them to  the best of my ability.  The patient was notified that they may return to the emergency department at any time for further treatment.    Final Clinical Impression(s) / ED Diagnoses Final diagnoses:  Epigastric pain    Rx / DC Orders ED Discharge Orders     None         Mare Ferrari, PA-C 07/31/22 1809    Loetta Rough, MD 08/01/22 [phone]

## 2022-07-31 NOTE — ED Provider Triage Note (Cosign Needed)
Emergency Medicine Provider Triage Evaluation Note  [facility] , a 36 y.o. male  was evaluated in triage.  Pt complains of epigastric abdominal pain that radiates to his back.  Ongoing since last night.  Denies history of pancreatitis.  Denies nausea, vomiting.  Review of Systems  Positive: As above Negative: As above  Physical Exam  BP 122/87 (BP Location: Right Arm)   Pulse (!) 50   Temp 97.7 F (36.5 C) (Oral)   Resp 16   SpO2 100%  Gen:   Awake, no distress   Resp:  Normal effort  MSK:   Moves extremities without difficulty  Other:    Medical Decision Making  Medically screening exam initiated at 12:43 PM.  Appropriate orders placed.  Jasier B Hair was informed that the remainder of the evaluation will be completed by another provider, this initial triage assessment does not replace that evaluation, and the importance of remaining in the ED until their evaluation is complete.     Evlyn Courier, PA-C 07/31/22 1245

## 2022-07-31 NOTE — ED Notes (Signed)
Pt left AMA with family. Ambulatory to waiting room at this time.

## 2022-07-31 NOTE — ED Notes (Signed)
Pt is being aggressive toward staff and pt  stated that he wants to leave but at the sometime wants to be seen. Pt asked if he could get his results I informed the is he welcome to go to his mychart  to see his result. Pt stated that he doesn't have to login to mychart he wants to see that doctor.  Pt keep saying he want to leave but not before he gets is result. I Informed pt that it is his choice if he wants to leave and that no one is holding him here. Pt stated no I'm not leaving until get my result.

## 2022-07-31 NOTE — ED Notes (Signed)
Pt stated that he was going to leave by 5

## 2022-07-31 NOTE — ED Notes (Signed)
Pt wanted to leave AMA due to long wait times. IV access was removed in order for him to do so. Pt has decided to stay and wait to see provider.

## 2023-05-28 ENCOUNTER — Other Ambulatory Visit: Payer: Self-pay

## 2023-05-28 ENCOUNTER — Emergency Department (HOSPITAL_COMMUNITY): Payer: 59

## 2023-05-28 ENCOUNTER — Emergency Department (HOSPITAL_COMMUNITY)
Admission: EM | Admit: 2023-05-28 | Discharge: 2023-05-28 | Disposition: A | Discharge status: Home / Self Care | Payer: 59 | Attending: Emergency Medicine | Admitting: Emergency Medicine

## 2023-05-28 ENCOUNTER — Ambulatory Visit
Admission: EM | Admit: 2023-05-28 | Discharge: 2023-05-28 | Disposition: A | Discharge status: Home / Self Care | Payer: 59

## 2023-05-28 DIAGNOSIS — W268XXA Contact with other sharp object(s), not elsewhere classified, initial encounter: Secondary | ICD-10-CM | POA: Diagnosis not present

## 2023-05-28 DIAGNOSIS — Y990 Civilian activity done for income or pay: Secondary | ICD-10-CM | POA: Insufficient documentation

## 2023-05-28 DIAGNOSIS — S61215A Laceration without foreign body of left ring finger without damage to nail, initial encounter: Secondary | ICD-10-CM | POA: Diagnosis not present

## 2023-05-28 DIAGNOSIS — S61412A Laceration without foreign body of left hand, initial encounter: Secondary | ICD-10-CM

## 2023-05-28 DIAGNOSIS — S6992XA Unspecified injury of left wrist, hand and finger(s), initial encounter: Secondary | ICD-10-CM | POA: Diagnosis present

## 2023-05-28 MED ORDER — CEPHALEXIN 500 MG PO CAPS: 500.0000 mg | ORAL_CAPSULE | Freq: Four times a day (QID) | ORAL | 0 refills | Status: AC

## 2023-05-28 MED ORDER — LIDOCAINE HCL (PF) 1 % IJ SOLN
5.0000 mL | Freq: Once | INTRAMUSCULAR | Status: AC
  Administered 2023-05-28: 5 mL
  Filled 2023-05-28: qty 30

## 2023-05-28 MED ORDER — BACITRACIN ZINC 500 UNIT/GM EX OINT
TOPICAL_OINTMENT | Freq: Two times a day (BID) | CUTANEOUS | Status: DC
  Filled 2023-05-28: qty 0.9

## 2023-05-28 MED ORDER — PENTAFLUOROPROP-TETRAFLUOROETH EX AERO
INHALATION_SPRAY | Freq: Once | CUTANEOUS | Status: DC
  Filled 2023-05-28: qty 116

## 2023-05-28 NOTE — ED Provider Notes (Signed)
Patient presents today with significant injury to his left hand.  He reports that he dropped a 3 time jack onto the proximal fourth left finger.  He notes that he has seen the bone from injury.  He does have some decreased range of motion of finger due to injury.  Given severity and mechanism of injury with concern for possible fracture underlying recommended further evaluation in the emergency room.  Patient expressed understanding and will report to ED after leaving our office.   Tomi Bamberger, PA-C 05/28/23 [phone]

## 2023-05-28 NOTE — Discharge Instructions (Signed)
  Please report to ED for further evaluation.  

## 2023-05-28 NOTE — ED Triage Notes (Signed)
Pt c/o laceration to finger  a result of the  3 ton [employer], hit his left hand penetrating the left ring finger. Pt states his fingerss are numb, unable to move them.

## 2023-05-28 NOTE — ED Provider Notes (Signed)
[place] EMERGENCY DEPARTMENT AT [facility] Provider Note   CSN: [id number] Arrival date & time: 05/28/23  1556     History  Chief Complaint  Patient presents with   Finger Injury    Thomas Delacruz is a 37 y.o. male presented with left ring finger injury that occurred earlier today.  Patient states that he was at work when he dropped a jack on his hand.  Patient states that the jack cut his hand and that he can see down to the bone.  Patient is still able to move his finger and has sensation distally.  Patient is unsure of last tetanus.  Patient denies any blood thinners or skin color changes.    Home Medications Prior to Admission medications   Medication Sig Start Date End Date Taking? Authorizing Provider  cephALEXin (KEFLEX) 500 MG capsule Take 1 capsule (500 mg total) by mouth 4 (four) times daily. 05/28/23  Yes Elizabth Palka, Beverly Gust, PA-C  benzonatate (TESSALON) 100 MG capsule Take 1 capsule (100 mg total) by mouth every 8 (eight) hours. Patient not taking: Reported on 03/09/2020 11/29/17   Linwood Dibbles, MD  cyclobenzaprine (FLEXERIL) 5 MG tablet Take 1 tablet (5 mg total) by mouth 2 (two) times daily as needed for muscle spasms. Patient not taking: Reported on 03/09/2020 08/23/15   Doreene Eland, MD  naproxen (NAPROSYN) 500 MG tablet Take 1 tablet (500 mg total) by mouth 2 (two) times daily. Patient not taking: Reported on 03/09/2020 08/23/15   Doreene Eland, MD  ondansetron (ZOFRAN ODT) 8 MG disintegrating tablet Take 1 tablet (8 mg total) by mouth every 8 (eight) hours as needed for nausea or vomiting. Patient not taking: Reported on 03/09/2020 11/29/17   Linwood Dibbles, MD  oseltamivir (TAMIFLU) 75 MG capsule Take 1 capsule (75 mg total) by mouth 2 (two) times daily. Patient not taking: Reported on 03/09/2020 11/29/17   Linwood Dibbles, MD      Allergies    Patient has no known allergies.    Review of Systems   Review of Systems  Physical Exam Updated Vital  Signs Pulse (!) 54   Temp 97.8 F (36.6 C) (Oral)   Ht 6\' 3"  (1.905 m)   Wt 81.6 kg   SpO2 100%   BMI 22.50 kg/m  Physical Exam Constitutional:      General: He is not in acute distress. Cardiovascular:     Pulses: Normal pulses.     Comments: 2+ left radial pulse slightly bradycardic Musculoskeletal:        General: Normal range of motion.  Skin:    General: Skin is warm and dry.     Capillary Refill: Capillary refill takes less than 2 seconds.     Comments: 3 cm laceration noted to base of the left ring finger, able to visualize bone  Neurological:     Mental Status: He is alert.     Comments: Sensation intact distally     ED Results / Procedures / Treatments   Labs (all labs ordered are listed, but only abnormal results are displayed) Labs Reviewed - No data to display  EKG None  Radiology DG Hand Complete Left  Result Date: 05/28/2023 CLINICAL DATA:  Hand injury.  Pain EXAM: LEFT HAND - COMPLETE 3 VIEW COMPARISON:  None Available. FINDINGS: There is no evidence of fracture or dislocation. There is no evidence of arthropathy or other focal bone abnormality. Soft tissues are unremarkable. IMPRESSION: No acute osseous abnormality Electronically Signed  By: Karen Kays M.D.   On: 05/28/2023 16:50    Procedures .Marland KitchenLaceration Repair  Date/Time: 05/28/2023 6:05 PM  Performed by: Netta Corrigan, PA-C Authorized by: Netta Corrigan, PA-C   Consent:    Consent obtained:  Verbal   Consent given by:  Patient   Risks, benefits, and alternatives were discussed: yes     Risks discussed:  Infection, need for additional repair, nerve damage and pain Universal protocol:    Imaging studies available: yes   Anesthesia:    Anesthesia method:  Local infiltration   Local anesthetic:  Lidocaine 1% w/o epi Laceration details:    Location:  Finger   Finger location:  L ring finger   Length (cm):  3   Depth (mm):  4 Treatment:    Area cleansed with:  Chlorhexidine   Amount  of cleaning:  Standard   Irrigation solution:  Sterile saline   Irrigation volume:  200 mL   Irrigation method:  Pressure wash   Visualized foreign bodies/material removed: no     Debridement:  None Skin repair:    Repair method:  Sutures   Suture size:  5-0   Suture material:  Prolene   Suture technique:  Simple interrupted   Number of sutures:  7 Approximation:    Approximation:  Close Repair type:    Repair type:  Intermediate Post-procedure details:    Dressing:  Antibiotic ointment, bulky dressing and non-adherent dressing   Procedure completion:  Tolerated     Medications Ordered in ED Medications  bacitracin ointment (has no administration in time range)  lidocaine (PF) (XYLOCAINE) 1 % injection 5 mL (5 mLs Infiltration Given by Other 05/28/23 1715)    ED Course/ Medical Decision Making/ A&P                             Medical Decision Making Amount and/or Complexity of Data Reviewed Radiology: ordered.  Risk OTC drugs. Prescription drug management.   Kandis Mannan 37 y.o. presented today for a 3 cm laceration to their left ring finger. Working DDx that I considered at this time includes, but not limited to, FB, fracture, NV compromise, simple laceration.  R/o DDx: FB, fracture, NV compromise: These are considered less likely due to history of present illness and physical exam findings  Review of prior external notes: 05/28/2023 urgent care  Unique Tests and My Interpretation:  Left hand x-ray: No acute fractures  Discussion with Independent Historian: None  Discussion of Management of Tests:  Dumonski, MD Hand  Risk: Medium: prescription drug management  Risk Stratification Score: none  Plan: Patient presented for a 3 cm laceration to their left ring finger. They are neurovascularly intact. Tetanus will be updated. Patient is in no distress. Laceration will be repaired with standard wound care procedures and antibiotic ointment.  Hand will be  consulted due to being able to visualize bone.  Patient/family educated about specific return precautions for given chief complaint and symptoms.  Patient/family educated about follow-up with PCP and hand.  Patient was educated to have sutures removed in Digits, palm, and sole - 10 to 14 days.   Patient explicitly told about importance of hand follow up.   Laceration repaired without complication and with pulse, motor, sensation intact.  7 sutures were placed without complication.  Patient is stable for discharge at this time. Patient/family expressed understanding of return precautions and need for follow-up. Patient spoken to regarding  all imaging and laboratory results and appropriate follow up for these results. All education provided in verbal form with additional information in written form. Time was allowed for answering of patient questions. Patient discharged.          Final Clinical Impression(s) / ED Diagnoses Final diagnoses:  Laceration of left ring finger without foreign body without damage to nail, initial encounter    Rx / DC Orders ED Discharge Orders          Ordered    cephALEXin (KEFLEX) 500 MG capsule  4 times daily        05/28/23 1801              Netta Corrigan, PA-C 05/28/23 1807    Tegeler, Canary Brim, MD 05/28/23 [phone]

## 2023-05-28 NOTE — ED Notes (Signed)
Patient is being discharged from the Urgent Care and sent to the Emergency Department via POV . Per Erma Pinto, PA-C, patient is in need of higher level of care due to Laceration to the left hand caused by a 3 ton [employer]. Patient is aware and verbalizes understanding of plan of care.  Vitals:   05/28/23 1508  BP: 116/76  Pulse: 60  Resp: 13  Temp: 98.2 F (36.8 C)  SpO2: 98%

## 2023-05-28 NOTE — Discharge Instructions (Addendum)
Please follow-up with the hand specialist I have attached your for you today.  I have also prescribed for you antibiotics to take for your laceration.  You have 7 sutures placed that will need to be removed in 10 to 14 days.  Please keep the wound clean and dry and if symptoms change or worsen please return to ER.

## 2023-05-28 NOTE — ED Triage Notes (Signed)
Pt arrived via POV. Pt referred from UC. Pt's L ring finger was struck by heavy metal object. Pt has deep lac at base of ring finger, pt's finger is numb.  AOx4

## 2023-06-20 ENCOUNTER — Other Ambulatory Visit: Payer: Self-pay

## 2023-06-20 ENCOUNTER — Encounter (HOSPITAL_COMMUNITY): Payer: Self-pay | Admitting: Emergency Medicine

## 2023-06-20 ENCOUNTER — Emergency Department (HOSPITAL_COMMUNITY)
Admission: EM | Admit: 2023-06-20 | Discharge: 2023-06-21 | Disposition: A | Discharge status: Home / Self Care | Payer: 59 | Attending: Emergency Medicine | Admitting: Emergency Medicine

## 2023-06-20 DIAGNOSIS — S61511A Laceration without foreign body of right wrist, initial encounter: Secondary | ICD-10-CM | POA: Insufficient documentation

## 2023-06-20 DIAGNOSIS — X18XXXA Contact with other hot metals, initial encounter: Secondary | ICD-10-CM | POA: Diagnosis not present

## 2023-06-20 DIAGNOSIS — S6991XA Unspecified injury of right wrist, hand and finger(s), initial encounter: Secondary | ICD-10-CM | POA: Diagnosis present

## 2023-06-20 NOTE — ED Triage Notes (Signed)
Patient fell off his metal trailer and scraped R wrist on his trailer during fall. Has lac to the inside part of his wrist. Bleeding controlled. Denies neck back pain from fall. Not UTD on tetanus.

## 2023-06-21 MED ORDER — OXYCODONE HCL 5 MG PO TABS
5.0000 mg | ORAL_TABLET | Freq: Once | ORAL | Status: AC
  Administered 2023-06-21: 5 mg via ORAL
  Filled 2023-06-21: qty 1

## 2023-06-21 MED ORDER — LIDOCAINE-EPINEPHRINE (PF) 2 %-1:200000 IJ SOLN
20.0000 mL | Freq: Once | INTRAMUSCULAR | Status: AC
  Administered 2023-06-21: 20 mL

## 2023-06-21 NOTE — ED Provider Notes (Signed)
[place] EMERGENCY DEPARTMENT AT [facility] Provider Note   CSN: [id number] Arrival date & time: 06/20/23  1919     History  Chief Complaint  Patient presents with   Laceration    Thomas Delacruz is a 37 y.o. male with no past medical history presents to the ED complaining of a laceration to his right wrist.  States that he accidentally cut it on a metal trailer.  He does not take blood thinners.  Last tetanus was this year.  No other injuries.       Home Medications Prior to Admission medications   Medication Sig Start Date End Date Taking? Authorizing Provider  benzonatate (TESSALON) 100 MG capsule Take 1 capsule (100 mg total) by mouth every 8 (eight) hours. Patient not taking: Reported on 03/09/2020 11/29/17   Linwood Dibbles, MD  cephALEXin (KEFLEX) 500 MG capsule Take 1 capsule (500 mg total) by mouth 4 (four) times daily. 05/28/23   Netta Corrigan, PA-C  cyclobenzaprine (FLEXERIL) 5 MG tablet Take 1 tablet (5 mg total) by mouth 2 (two) times daily as needed for muscle spasms. Patient not taking: Reported on 03/09/2020 08/23/15   Doreene Eland, MD  naproxen (NAPROSYN) 500 MG tablet Take 1 tablet (500 mg total) by mouth 2 (two) times daily. Patient not taking: Reported on 03/09/2020 08/23/15   Doreene Eland, MD  ondansetron (ZOFRAN ODT) 8 MG disintegrating tablet Take 1 tablet (8 mg total) by mouth every 8 (eight) hours as needed for nausea or vomiting. Patient not taking: Reported on 03/09/2020 11/29/17   Linwood Dibbles, MD  oseltamivir (TAMIFLU) 75 MG capsule Take 1 capsule (75 mg total) by mouth 2 (two) times daily. Patient not taking: Reported on 03/09/2020 11/29/17   Linwood Dibbles, MD      Allergies    Patient has no known allergies.    Review of Systems   Review of Systems  All other systems reviewed and are negative.   Physical Exam Updated Vital Signs BP 127/83 (BP Location: Left Arm)   Pulse (!) 46   Temp 98.3 F (36.8 C) (Oral)   Resp 20   Ht 6'  3" (1.905 m)   Wt 81 kg   SpO2 100%   BMI 22.32 kg/m  Physical Exam Vitals and nursing note reviewed.  Constitutional:      General: He is not in acute distress.    Appearance: Normal appearance.  HENT:     Head: Normocephalic and atraumatic.     Mouth/Throat:     Mouth: Mucous membranes are moist.  Eyes:     Conjunctiva/sclera: Conjunctivae normal.  Cardiovascular:     Rate and Rhythm: Normal rate and regular rhythm.  Pulmonary:     Effort: Pulmonary effort is normal.     Breath sounds: Normal breath sounds.  Abdominal:     General: Abdomen is flat.     Palpations: Abdomen is soft.  Musculoskeletal:     Cervical back: Neck supple.     Comments: 1.5 cm linear laceration to the volar aspect of the right distal wrist, no active bleeding, 2+ radial pulse, normal sensation distally, normal capillary refill, range of motion of the right upper extremity intact  Skin:    General: Skin is warm and dry.     Capillary Refill: Capillary refill takes less than 2 seconds.  Neurological:     Mental Status: He is alert. Mental status is at baseline.  Psychiatric:  Behavior: Behavior normal.     ED Results / Procedures / Treatments   Labs (all labs ordered are listed, but only abnormal results are displayed) Labs Reviewed - No data to display  EKG None  Radiology No results found.  Procedures .Marland KitchenLaceration Repair  Date/Time: 06/21/2023 1:15 AM  Performed by: Tonette Lederer, PA-C Authorized by: Tonette Lederer, PA-C   Consent:    Consent obtained:  Verbal   Consent given by:  Patient   Risks, benefits, and alternatives were discussed: yes     Risks discussed:  Infection, need for additional repair, pain, retained foreign body, poor cosmetic result and poor wound healing   Alternatives discussed:  No treatment, delayed treatment and observation Universal protocol:    Procedure explained and questions answered to patient or proxy's satisfaction: yes     Immediately  prior to procedure, a time out was called: yes     Patient identity confirmed:  Verbally with patient Anesthesia:    Anesthesia method:  Local infiltration   Local anesthetic:  Lidocaine 2% WITH epi Laceration details:    Length (cm):  1.5 Pre-procedure details:    Preparation:  Patient was prepped and draped in usual sterile fashion Exploration:    Hemostasis achieved with:  Epinephrine   Imaging outcome: foreign body not noted     Wound exploration: wound explored through full range of motion and entire depth of wound visualized   Treatment:    Area cleansed with:  Povidone-iodine   Amount of cleaning:  Extensive   Irrigation solution:  Sterile saline   Irrigation method:  Syringe Skin repair:    Repair method:  Sutures   Suture size:  4-0   Suture material:  Nylon   Suture technique:  Simple interrupted   Number of sutures:  2 Approximation:    Approximation:  Close Repair type:    Repair type:  Simple Post-procedure details:    Dressing:  Non-adherent dressing   Procedure completion:  Tolerated well, no immediate complications     Medications Ordered in ED Medications  lidocaine-EPINEPHrine (XYLOCAINE W/EPI) 2 %-1:200000 (PF) injection 20 mL (has no administration in time range)  oxyCODONE (Oxy IR/ROXICODONE) immediate release tablet 5 mg (has no administration in time range)    ED Course/ Medical Decision Making/ A&P                                 Medical Decision Making Risk Prescription drug management.   Medical Decision Making:   Thomas Delacruz is a 37 y.o. male who presented to the ED today with laceration detailed above.    Patient's presentation is complicated by their history of trauma.  Complete initial physical exam performed, notably the patient  was in no acute distress.  He had a open area 1.5 cm laceration to the right wrist.  Bleeding controlled.  Neurovascularly intact distally.    Reviewed and confirmed nursing documentation for past  medical history, family history, social history.    Initial Assessment:   With the patient's presentation of laceration, differential diagnosis includes but is not limited to simple vs intermediate vs complex laceration, tendon injury, nerve injury, retained foreign body, fracture, dislocation, wound infection, skin tear, abrasion.  This is most consistent with an acute complicated illness  Initial Plan:  Tdap booster up to date and/or given during today's visit Extensive irrigation performed  Pain management as needed Laceration repair as indicated  Objective evaluation as reviewed    Final Assessment and Plan:   Laceration occurred < 8 hours prior to repair which was well tolerated. Pt has no co morbidities to affect normal wound healing. Discussed suture home care with pt and answered questions. Pt to follow up for wound check and suture removal in 7 days or earlier for any concerns. Pt is hemodynamically stable with no complaints prior to discharge. Strict ED return precautions given for wound complications, infection, recurrent injury, etc.     Clinical Impression:  1. Laceration of right wrist, initial encounter      Discharge           Final Clinical Impression(s) / ED Diagnoses Final diagnoses:  Laceration of right wrist, initial encounter    Rx / DC Orders ED Discharge Orders     None         Tonette Lederer, PA-C 06/21/23 0138    Shon Baton, MD 06/21/23 [phone]

## 2023-06-21 NOTE — Discharge Instructions (Signed)
Keep wound clean and dry. I recommend keeping bandage if working, outside, or where it may get dirty and there is a risk for infection. Return for suture removal in 1 week or earlier for concerns of infection including redness, pus like drainage, or fever.

## 2024-06-09 ENCOUNTER — Other Ambulatory Visit: Payer: Self-pay

## 2024-06-09 ENCOUNTER — Ambulatory Visit
Admission: EM | Admit: 2024-06-09 | Discharge: 2024-06-09 | Disposition: A | Discharge status: Home / Self Care | Payer: Self-pay | Attending: Internal Medicine | Admitting: Internal Medicine

## 2024-06-09 ENCOUNTER — Encounter: Payer: Self-pay | Admitting: *Deleted

## 2024-06-09 DIAGNOSIS — J069 Acute upper respiratory infection, unspecified: Secondary | ICD-10-CM

## 2024-06-09 MED ORDER — PREDNISONE 20 MG PO TABS: 40.0000 mg | ORAL_TABLET | Freq: Every day | ORAL | 0 refills | Status: AC

## 2024-06-09 NOTE — ED Provider Notes (Signed)
 [facility]    CSN: [id number] Arrival date & time: 06/09/24  1159      History   Chief Complaint Chief Complaint  Patient presents with   Generalized Body Aches    HPI Trafton B Sheerin is a 38 y.o. male.   38 year old male who presents urgent care with complaints of congestion, body aches, headaches, fever and loss of taste and smell.  He reports that this been going on for about 2 to 3 days.  He relates that almost everyone in his family has recently had the same symptoms.  He has been taking over-the-counter medication and does feel a little bit better today but still feels very weak and having a lot of bodyaches.  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  He is able to eat and drink although not as much as usual.  He denies any slurred speech, visual changes, weakness in the extremities, numbness or tingling.     No past medical history on file.  There are no active problems to display for this patient.   No past surgical history on file.     Home Medications    Prior to Admission medications   Medication Sig Start Date End Date Taking? Authorizing Provider  predniSONE  (DELTASONE ) 20 MG tablet Take 2 tablets (40 mg total) by mouth daily with breakfast for 5 days. 06/09/24 06/14/24 Yes Tacey Dimaggio A, PA-C  benzonatate  (TESSALON ) 100 MG capsule Take 1 capsule (100 mg total) by mouth every 8 (eight) hours. Patient not taking: Reported on 03/09/2020 11/29/17   Randol Simmonds, MD  cephALEXin  (KEFLEX ) 500 MG capsule Take 1 capsule (500 mg total) by mouth 4 (four) times daily. Patient not taking: Reported on 06/09/2024 05/28/23   Victor Lynwood DASEN, PA-C  cyclobenzaprine  (FLEXERIL ) 5 MG tablet Take 1 tablet (5 mg total) by mouth 2 (two) times daily as needed for muscle spasms. Patient not taking: Reported on 03/09/2020 08/23/15   Anders Otto DASEN, MD  naproxen  (NAPROSYN ) 500 MG tablet Take 1 tablet (500 mg total) by mouth 2 (two) times  daily. Patient not taking: Reported on 03/09/2020 08/23/15   Anders Otto DASEN, MD  ondansetron  (ZOFRAN  ODT) 8 MG disintegrating tablet Take 1 tablet (8 mg total) by mouth every 8 (eight) hours as needed for nausea or vomiting. Patient not taking: Reported on 03/09/2020 11/29/17   Randol Simmonds, MD  oseltamivir  (TAMIFLU ) 75 MG capsule Take 1 capsule (75 mg total) by mouth 2 (two) times daily. Patient not taking: Reported on 03/09/2020 11/29/17   Randol Simmonds, MD    Family History No family history on file.  Social History Social History   Tobacco Use   Smoking status: Never   Smokeless tobacco: Never  Vaping Use   Vaping status: Every Day  Substance Use Topics   Alcohol use: Yes   Drug use: Never     Allergies   Patient has no known allergies.   Review of Systems Review of Systems  Constitutional:  Positive for appetite change, chills and fever.  HENT:  Positive for congestion. Negative for ear pain and sore throat.   Eyes:  Negative for pain and visual disturbance.  Respiratory:  Negative for cough and shortness of breath.   Cardiovascular:  Negative for chest pain and palpitations.  Gastrointestinal:  Negative for abdominal pain and vomiting.  Genitourinary:  Negative for dysuria and hematuria.  Musculoskeletal:  Positive for myalgias. Negative for arthralgias and back pain.  Skin:  Negative for color change and rash.  Neurological:  Positive for headaches. Negative for seizures and syncope.  All other systems reviewed and are negative.    Physical Exam Triage Vital Signs ED Triage Vitals  Encounter Vitals Group     BP 06/09/24 1248 119/80     Girls Systolic BP Percentile --      Girls Diastolic BP Percentile --      Boys Systolic BP Percentile --      Boys Diastolic BP Percentile --      Pulse Rate 06/09/24 1248 64     Resp 06/09/24 1248 18     Temp 06/09/24 1248 98.2 F (36.8 C)     Temp Source 06/09/24 1248 Oral     SpO2 06/09/24 1248 96 %     Weight --       Height --      Head Circumference --      Peak Flow --      Pain Score 06/09/24 1246 6     Pain Loc --      Pain Education --      Exclude from Growth Chart --    No data found.  Updated Vital Signs BP 119/80 (BP Location: Left Arm)   Pulse 64   Temp 98.2 F (36.8 C) (Oral)   Resp 18   SpO2 96%   Visual Acuity Right Eye Distance:   Left Eye Distance:   Bilateral Distance:    Right Eye Near:   Left Eye Near:    Bilateral Near:     Physical Exam Vitals and nursing note reviewed.  Constitutional:      General: He is not in acute distress.    Appearance: Normal appearance. He is well-developed.  HENT:     Head: Normocephalic and atraumatic.     Nose: Congestion present.     Mouth/Throat:     Mouth: Mucous membranes are moist.  Eyes:     Conjunctiva/sclera: Conjunctivae normal.  Cardiovascular:     Rate and Rhythm: Normal rate and regular rhythm.     Heart sounds: No murmur heard. Pulmonary:     Effort: Pulmonary effort is normal. No respiratory distress.     Breath sounds: Normal breath sounds. No wheezing or rhonchi.  Abdominal:     Palpations: Abdomen is soft.     Tenderness: There is no abdominal tenderness.  Musculoskeletal:        General: No swelling.     Cervical back: Neck supple.  Skin:    General: Skin is warm and dry.     Capillary Refill: Capillary refill takes less than 2 seconds.  Neurological:     General: No focal deficit present.     Mental Status: He is alert and oriented to person, place, and time.     Motor: No weakness.     Coordination: Coordination normal.     Gait: Gait normal.  Psychiatric:        Mood and Affect: Mood normal.      UC Treatments / Results  Labs (all labs ordered are listed, but only abnormal results are displayed) Labs Reviewed - No data to display  EKG   Radiology No results found.  Procedures Procedures (including critical care time)  Medications Ordered in UC Medications - No data to  display  Initial Impression / Assessment and Plan / UC Course  I have reviewed the triage vital signs and the nursing notes.  Pertinent labs & imaging results  that were available during my care of the patient were reviewed by me and considered in my medical decision making (see chart for details).     Viral upper respiratory infection  Symptoms are most consistent with a viral infection.  This does not require antibiotic treatment.  We focus treatment on improving the symptoms.  We will treat with the following:  Prednisone  40 mg (2 tablets) once daily for 5 days. Take this in the morning.  This is a steroid to help with inflammation and pain.  Do not take ibuprofen  while you are taking this medication You may use Tylenol  for fevers or pain Make sure to stay hydrated by drinking plenty of water. Rest when needed. Return to urgent care or PCP if symptoms worsen or fail to resolve.    Final Clinical Impressions(s) / UC Diagnoses   Final diagnoses:  Viral upper respiratory infection     Discharge Instructions      Symptoms are most consistent with a viral infection.  This does not require antibiotic treatment.  We focus treatment on improving the symptoms.  We will treat with the following:  Prednisone  40 mg (2 tablets) once daily for 5 days. Take this in the morning.  This is a steroid to help with inflammation and pain.  Do not take ibuprofen  while you are taking this medication You may use Tylenol  for fevers or pain Make sure to stay hydrated by drinking plenty of water. Rest when needed. Return to urgent care or PCP if symptoms worsen or fail to resolve.      ED Prescriptions     Medication Sig Dispense Auth. Provider   predniSONE  (DELTASONE ) 20 MG tablet Take 2 tablets (40 mg total) by mouth daily with breakfast for 5 days. 10 tablet Teresa Almarie LABOR, [place]      PDMP not reviewed this encounter.   Teresa Almarie LABOR, [place] 06/09/24 1327

## 2024-06-09 NOTE — Discharge Instructions (Addendum)
 Symptoms are most consistent with a viral infection.  This does not require antibiotic treatment.  We focus treatment on improving the symptoms.  We will treat with the following:  Prednisone  40 mg (2 tablets) once daily for 5 days. Take this in the morning.  This is a steroid to help with inflammation and pain.  Do not take ibuprofen  while you are taking this medication You may use Tylenol  for fevers or pain Make sure to stay hydrated by drinking plenty of water. Rest when needed. Return to urgent care or PCP if symptoms worsen or fail to resolve.

## 2024-06-09 NOTE — ED Triage Notes (Signed)
 Pt c/o headache, body aches, loss of taste and smell, fever 101, since Friday. Taking tylenol  cold and flu. None taken today
